# Patient Record
Sex: Male | Born: 1985 | Race: Black or African American | Hispanic: No | Marital: Single | Smoking: Former smoker
Health system: Southern US, Community
[De-identification: ages and names within clinical notes are randomized; demographics above are authoritative.]

## PROBLEM LIST (undated history)

## (undated) DIAGNOSIS — T7840XA Allergy, unspecified, initial encounter: Secondary | ICD-10-CM

## (undated) DIAGNOSIS — K219 Gastro-esophageal reflux disease without esophagitis: Secondary | ICD-10-CM

## (undated) HISTORY — DX: Gastro-esophageal reflux disease without esophagitis: K21.9

## (undated) HISTORY — DX: Allergy, unspecified, initial encounter: T78.40XA

---

## 2009-12-19 ENCOUNTER — Encounter: Admission: RE | Admit: 2009-12-19 | Discharge: 2009-12-19 | Payer: Self-pay | Admitting: Occupational Medicine

## 2010-09-26 ENCOUNTER — Emergency Department: Payer: Self-pay | Admitting: Emergency Medicine

## 2012-03-04 ENCOUNTER — Telehealth: Payer: Self-pay | Admitting: Cardiology

## 2012-03-04 NOTE — Telephone Encounter (Signed)
Spoke with pharmacy and they didn't even have a record of calling

## 2012-03-04 NOTE — Telephone Encounter (Signed)
[facility] called to get refill of cozaar 100 mg Please fax to [phone]

## 2015-06-26 ENCOUNTER — Encounter: Payer: Self-pay | Admitting: Family Medicine

## 2015-06-26 ENCOUNTER — Ambulatory Visit (INDEPENDENT_AMBULATORY_CARE_PROVIDER_SITE_OTHER): Payer: 59 | Admitting: Family Medicine

## 2015-06-26 VITALS — BP 95/61 | HR 86 | Resp 16 | Ht 71.0 in | Wt 161.8 lb

## 2015-06-26 DIAGNOSIS — K219 Gastro-esophageal reflux disease without esophagitis: Secondary | ICD-10-CM | POA: Diagnosis not present

## 2015-06-26 MED ORDER — PANTOPRAZOLE SODIUM 40 MG PO TBEC: 40.0000 mg | DELAYED_RELEASE_TABLET | Freq: Every day | ORAL | Status: DC

## 2015-06-26 NOTE — Progress Notes (Signed)
  Name: Alexander Ball   MRN: [id number]    DOB: 05-12-1986   Date:06/26/2015       Progress Note  Subjective  Chief Complaint  Chief Complaint  Patient presents with  . Heartburn    HPI  Here C/0 "heartburn".  Has reflux issues.  Occ. Related to certain foods, and sometimes not.  Feels acid in back of throat occ.  Has taken Prilosec OTC in past.  Has had sx. On and off for many years.     Past Medical History  Diagnosis Date  . GERD (gastroesophageal reflux disease)     History  Substance Use Topics  . Smoking status: Former Smoker    Types: Cigarettes    Quit date: 06/25/2014  . Smokeless tobacco: Never Used  . Alcohol Use: Yes    No current outpatient prescriptions on file.  Allergies  Allergen Reactions  . Amoxicillin Rash    Review of Systems  Constitutional: Negative for fever, chills, weight loss and malaise/fatigue.  HENT: Positive for congestion.   Eyes: Negative for blurred vision and double vision.  Respiratory: Negative for cough, sputum production, shortness of breath and wheezing.   Cardiovascular: Negative for chest pain, palpitations, claudication and leg swelling.  Gastrointestinal: Positive for heartburn. Negative for nausea, vomiting, abdominal pain, diarrhea, constipation and blood in stool.  Genitourinary: Negative for dysuria, urgency and frequency.  Musculoskeletal: Negative for myalgias and joint pain.  Skin: Negative for rash.  Neurological: Negative for dizziness, weakness and headaches.  Psychiatric/Behavioral: Negative for depression. The patient is not nervous/anxious.       Objective  Filed Vitals:   06/26/15 1507  BP: 95/61  Pulse: 86  Resp: 16  Height:  (1.803 m)  Weight: 161 lb 12.8 oz (73.392 kg)     Physical Exam  Constitutional: He is oriented to person, place, and time and well-developed, well-nourished, and in no distress. No distress.  HENT:  Head: Normocephalic and atraumatic.  Eyes: Conjunctivae  and EOM are normal. Pupils are equal, round, and reactive to light.  Neck: Normal range of motion. Neck supple. No thyromegaly present.  Cardiovascular: Normal rate, regular rhythm, normal heart sounds and intact distal pulses.  Exam reveals no gallop and no friction rub.   No murmur heard. Pulmonary/Chest: Effort normal and breath sounds normal. No respiratory distress. He has no wheezes. He has no rales.  Abdominal: Soft. Bowel sounds are normal. He exhibits no distension and no mass. There is no tenderness.  Musculoskeletal: He exhibits no edema.  Lymphadenopathy:    He has no cervical adenopathy.  Neurological: He is alert and oriented to person, place, and time.  Vitals reviewed.        Assessment & Plan  1. Gastroesophageal reflux disease without esophagitis  - CBC with Differential - Comprehensive Metabolic Panel (CMET) - pantoprazole (PROTONIX) 40 MG tablet; Take 1 tablet (40 mg total) by mouth daily.  Dispense: 30 tablet; Refill: 6

## 2015-06-28 LAB — COMPREHENSIVE METABOLIC PANEL
A/G RATIO: 1.5 (ref 1.1–2.5)
ALBUMIN: 4.6 g/dL (ref 3.5–5.5)
ALT: 18 IU/L (ref 0–44)
AST: 16 IU/L (ref 0–40)
Alkaline Phosphatase: 62 IU/L (ref 39–117)
BILIRUBIN TOTAL: 0.3 mg/dL (ref 0.0–1.2)
BUN / CREAT RATIO: 7 — AB (ref 8–19)
BUN: 7 mg/dL (ref 6–20)
CO2: 23 mmol/L (ref 18–29)
Calcium: 9.7 mg/dL (ref 8.7–10.2)
Chloride: 102 mmol/L (ref 97–108)
Creatinine, Ser: 0.95 mg/dL (ref 0.76–1.27)
GFR, EST AFRICAN AMERICAN: 126 mL/min/{1.73_m2} (ref >59)
GFR, EST NON AFRICAN AMERICAN: 109 mL/min/{1.73_m2} (ref >59)
GLUCOSE: 86 mg/dL (ref 65–99)
Globulin, Total: 3.1 g/dL (ref 1.5–4.5)
POTASSIUM: 4.5 mmol/L (ref 3.5–5.2)
Sodium: 140 mmol/L (ref 134–144)
TOTAL PROTEIN: 7.7 g/dL (ref 6.0–8.5)

## 2015-06-28 LAB — CBC WITH DIFFERENTIAL/PLATELET
BASOS ABS: 0.2 10*3/uL (ref 0.0–0.2)
BASOS: 2 %
EOS (ABSOLUTE): 0.5 10*3/uL — ABNORMAL HIGH (ref 0.0–0.4)
EOS: 6 %
HEMATOCRIT: 39.8 % (ref 37.5–51.0)
HEMOGLOBIN: 13.6 g/dL (ref 12.6–17.7)
IMMATURE GRANS (ABS): 0 10*3/uL (ref 0.0–0.1)
IMMATURE GRANULOCYTES: 0 %
LYMPHS ABS: 2.2 10*3/uL (ref 0.7–3.1)
Lymphs: 26 %
MCH: 28.6 pg (ref 26.6–33.0)
MCHC: 34.2 g/dL (ref 31.5–35.7)
MCV: 84 fL (ref 79–97)
MONOCYTES: 11 %
Monocytes Absolute: 0.9 10*3/uL (ref 0.1–0.9)
NEUTROS ABS: 4.5 10*3/uL (ref 1.4–7.0)
NEUTROS PCT: 55 %
PLATELETS: 342 10*3/uL (ref 150–379)
RBC: 4.75 x10E6/uL (ref 4.14–5.80)
RDW: 13.1 % (ref 12.3–15.4)
WBC: 8.3 10*3/uL (ref 3.4–10.8)

## 2015-09-28 ENCOUNTER — Ambulatory Visit: Payer: 59 | Admitting: Family Medicine

## 2015-10-17 ENCOUNTER — Ambulatory Visit: Payer: 59 | Admitting: Family Medicine

## 2015-11-12 ENCOUNTER — Encounter: Payer: Self-pay | Admitting: Family Medicine

## 2015-11-12 ENCOUNTER — Ambulatory Visit (INDEPENDENT_AMBULATORY_CARE_PROVIDER_SITE_OTHER): Payer: 59 | Admitting: Family Medicine

## 2015-11-12 VITALS — BP 104/70 | HR 75 | Resp 16 | Ht 71.0 in | Wt 163.2 lb

## 2015-11-12 DIAGNOSIS — Z720 Tobacco use: Secondary | ICD-10-CM | POA: Diagnosis not present

## 2015-11-12 DIAGNOSIS — Z87891 Personal history of nicotine dependence: Secondary | ICD-10-CM

## 2015-11-12 DIAGNOSIS — K219 Gastro-esophageal reflux disease without esophagitis: Secondary | ICD-10-CM

## 2015-11-12 NOTE — Progress Notes (Signed)
Name: Alexander Ball   MRN: [id number]    DOB: 1986/10/13   Date:11/12/2015       Progress Note  Subjective  Chief Complaint  Chief Complaint  Patient presents with  . Gastroesophageal Reflux    HPI Here for f/u of GERD.  Using Protonix x 4 months.  Doing very well.    Also to go over lab results.  No problem-specific assessment & plan notes found for this encounter.   Past Medical History  Diagnosis Date  . GERD (gastroesophageal reflux disease)     History reviewed. No pertinent past surgical history.  Family History  Problem Relation Age of Onset  . Diabetes Maternal Uncle   . Heart disease Maternal Uncle   . Hypertension Maternal Uncle     Social History   Social History  . Marital Status: Single    Spouse Name: N/A  . Number of Children: N/A  . Years of Education: N/A   Occupational History  . Not on file.   Social History Main Topics  . Smoking status: Former Smoker    Types: Cigarettes    Quit date: 06/25/2014  . Smokeless tobacco: Never Used  . Alcohol Use: Yes  . Drug Use: No  . Sexual Activity: Yes    Birth Control/ Protection: Inserts, Injection   Other Topics Concern  . Not on file   Social History Narrative     Current outpatient prescriptions:  .  pantoprazole (PROTONIX) 40 MG tablet, Take 1 tablet (40 mg total) by mouth daily., Disp: 30 tablet, Rfl: 6  Allergies  Allergen Reactions  . Amoxicillin Rash     Review of Systems  Constitutional: Negative for fever, chills, weight loss and malaise/fatigue.  HENT: Negative for hearing loss.   Eyes: Negative for blurred vision and double vision.  Respiratory: Negative for cough, shortness of breath and wheezing.   Cardiovascular: Negative for chest pain, palpitations and leg swelling.  Gastrointestinal: Negative for heartburn, abdominal pain and blood in stool.  Genitourinary: Negative for dysuria, urgency and frequency.  Musculoskeletal: Negative for myalgias and joint pain.   Skin: Negative for rash.  Neurological: Negative for dizziness, tremors, weakness and headaches.      Objective  Filed Vitals:   11/12/15 0937  BP: 104/70  Pulse: 75  Resp: 16  Height:  (1.803 m)  Weight: 163 lb 3.2 oz (74.027 kg)    Physical Exam  Constitutional: He is oriented to person, place, and time and well-developed, well-nourished, and in no distress. No distress.  HENT:  Head: Normocephalic and atraumatic.  Neck: Normal range of motion. Neck supple. Carotid bruit is not present. No thyromegaly present.  Cardiovascular: Normal rate, regular rhythm and normal heart sounds.  Exam reveals no gallop and no friction rub.   No murmur heard. Pulmonary/Chest: Effort normal and breath sounds normal. No respiratory distress. He has no wheezes. He has no rales.  Abdominal: Soft. Bowel sounds are normal. He exhibits no distension, no abdominal bruit and no mass. There is no tenderness.  Musculoskeletal: He exhibits no edema.  Lymphadenopathy:    He has no cervical adenopathy.  Neurological: He is alert and oriented to person, place, and time.  Vitals reviewed.      No results found for this or any previous visit (from the past 2160 hour(s)).   Assessment & Plan  Problem List Items Addressed This Visit      Digestive   GERD (gastroesophageal reflux disease) - Primary  Other   History of smoking      No orders of the defined types were placed in this encounter.   1. Gastroesophageal reflux disease, esophagitis presence not specified -may reduce Protonix to every other day and then prn use if tolerated  2. History of smoking

## 2016-02-07 ENCOUNTER — Telehealth: Payer: Self-pay

## 2016-02-07 ENCOUNTER — Other Ambulatory Visit: Payer: Self-pay

## 2016-02-07 DIAGNOSIS — Z202 Contact with and (suspected) exposure to infections with a predominantly sexual mode of transmission: Secondary | ICD-10-CM

## 2016-02-07 NOTE — Telephone Encounter (Signed)
Patient went to [facility] but I explained I can not order until you give me the codes. He will call in morning.[facility]

## 2016-02-07 NOTE — Telephone Encounter (Signed)
Check with me tomorrow with the choices of test and I will let you, which one to order.  Although I do not recommend this test at all.-jh

## 2016-02-07 NOTE — Telephone Encounter (Signed)
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 I explained that he could be carrier and test wont give him all answers. He is aware. I need to know which HSV to order please.

## 2016-02-08 NOTE — Telephone Encounter (Signed)
Need HSV 1 and 2 antibody titers.-jh

## 2016-02-09 LAB — HSV(HERPES SIMPLEX VRS) I + II AB-IGG: HSV 1 Glycoprotein G Ab, IgG: <0.91 index (ref 0.00–0.90)

## 2016-02-11 ENCOUNTER — Telehealth: Payer: Self-pay | Admitting: *Deleted

## 2016-02-18 ENCOUNTER — Telehealth: Payer: Self-pay | Admitting: Family Medicine

## 2016-02-18 NOTE — Telephone Encounter (Signed)
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  Please call [phone].

## 2016-02-18 NOTE — Telephone Encounter (Signed)
Pt was informed.

## 2016-03-04 NOTE — Telephone Encounter (Signed)
Erroneous error.

## 2016-03-05 ENCOUNTER — Other Ambulatory Visit: Payer: Self-pay | Admitting: Family Medicine

## 2016-03-05 DIAGNOSIS — K219 Gastro-esophageal reflux disease without esophagitis: Secondary | ICD-10-CM

## 2016-03-05 MED ORDER — PANTOPRAZOLE SODIUM 40 MG PO TBEC: 40.0000 mg | DELAYED_RELEASE_TABLET | Freq: Every day | ORAL | Status: DC

## 2016-03-22 ENCOUNTER — Ambulatory Visit
Admission: EM | Admit: 2016-03-22 | Discharge: 2016-03-22 | Disposition: A | Discharge status: Home / Self Care | Payer: 59 | Attending: Family Medicine | Admitting: Family Medicine

## 2016-03-22 DIAGNOSIS — N4889 Other specified disorders of penis: Secondary | ICD-10-CM

## 2016-03-22 DIAGNOSIS — N489 Disorder of penis, unspecified: Secondary | ICD-10-CM

## 2016-03-22 NOTE — ED Notes (Signed)
Pt's girlfriend was diagnosed with HSV1, and pt is experiencing a red lesion on his penis, and is wanting to be tested for HSV and other STDs.

## 2016-03-22 NOTE — ED Provider Notes (Signed)
CSN: [id number]     Arrival date & time 03/22/16  1021 History   First MD Initiated Contact with Patient 03/22/16 1316     Chief Complaint  Patient presents with  . Exposure to STD   (Consider location/radiation/quality/duration/timing/severity/associated sxs/prior Treatment) HPI Comments: 30 yo male with a c/o "spots on penis" and wanting to get checked to see if they are herpes lesions. States girlfriend diagnosed with HSV1. States the spots on penis are not blisters or painful/tender. Denies penile discharge, dysuria, hematuria. States was tested by PCP recently for HSV antibodies and tests were negative.   The history is provided by the patient.    Past Medical History  Diagnosis Date  . GERD (gastroesophageal reflux disease)    No past surgical history on file. Family History  Problem Relation Age of Onset  . Diabetes Maternal Uncle   . Heart disease Maternal Uncle   . Hypertension Maternal Uncle    Social History  Substance Use Topics  . Smoking status: Former Smoker    Types: Cigarettes    Quit date: 06/25/2014  . Smokeless tobacco: Never Used  . Alcohol Use: Yes    Review of Systems  Allergies  Amoxicillin  Home Medications   Prior to Admission medications   Medication Sig Start Date End Date Taking? Authorizing Provider  cetirizine (ZYRTEC) 10 MG tablet Take 10 mg by mouth daily.   Yes Historical Provider, MD  pantoprazole (PROTONIX) 40 MG tablet Take 1 tablet (40 mg total) by mouth daily. 03/05/16  Yes Janeann ForehandJames H Hawkins Jr., MD   Meds Ordered and Administered this Visit  Medications - No data to display  BP 114/77 mmHg  Pulse 71  Temp(Src) 97.8 F (36.6 C) (Oral)  Resp 16  Ht 5' 10.5" (1.791 m)  Wt 165 lb (74.844 kg)  BMI 23.33 kg/m2  SpO2 100% No data found.   Physical Exam  Abdominal: Hernia confirmed negative in the right inguinal area and confirmed negative in the left inguinal area.  Genitourinary: Testes normal and penis normal. Cremasteric  reflex is present. No penile erythema or penile tenderness. No discharge found.  Normal penile skin; irritated slightly erythematous hair follicle at base of penis, non-tender, no drainage; no vesicular lesions  Lymphadenopathy:       Right: No inguinal adenopathy present.       Left: No inguinal adenopathy present.  Nursing note and vitals reviewed.   ED Course  Procedures (including critical care time)  Labs Review Labs Reviewed - No data to display  Imaging Review No results found.   Visual Acuity Review  Right Eye Distance:   Left Eye Distance:   Bilateral Distance:    Right Eye Near:   Left Eye Near:    Bilateral Near:         MDM   1. Penile lesion    (benign; no signs or symptoms of herpes infection)    Discharge Medication List as of 03/22/2016  2:18 PM     1. diagnosis reviewed with patient 2. Follow-up prn if symptoms worsen    Payton Mccallumrlando Quierra Silverio, MD 03/31/16 2210

## 2016-12-06 DIAGNOSIS — Z23 Encounter for immunization: Secondary | ICD-10-CM | POA: Diagnosis not present

## 2017-02-04 ENCOUNTER — Other Ambulatory Visit: Payer: Self-pay | Admitting: Family Medicine

## 2017-02-04 DIAGNOSIS — K219 Gastro-esophageal reflux disease without esophagitis: Secondary | ICD-10-CM

## 2017-07-21 ENCOUNTER — Encounter: Payer: Self-pay | Admitting: Family Medicine

## 2017-07-21 ENCOUNTER — Ambulatory Visit (INDEPENDENT_AMBULATORY_CARE_PROVIDER_SITE_OTHER): Payer: 59 | Admitting: Family Medicine

## 2017-07-21 ENCOUNTER — Other Ambulatory Visit: Payer: Self-pay | Admitting: Family Medicine

## 2017-07-21 VITALS — BP 107/56 | HR 72 | Temp 97.6°F | Resp 16 | Ht 71.0 in | Wt 152.0 lb

## 2017-07-21 DIAGNOSIS — Z114 Encounter for screening for human immunodeficiency virus [HIV]: Secondary | ICD-10-CM

## 2017-07-21 DIAGNOSIS — R7989 Other specified abnormal findings of blood chemistry: Secondary | ICD-10-CM

## 2017-07-21 DIAGNOSIS — K219 Gastro-esophageal reflux disease without esophagitis: Secondary | ICD-10-CM | POA: Diagnosis not present

## 2017-07-21 DIAGNOSIS — J358 Other chronic diseases of tonsils and adenoids: Secondary | ICD-10-CM | POA: Insufficient documentation

## 2017-07-21 DIAGNOSIS — Z13220 Encounter for screening for lipoid disorders: Secondary | ICD-10-CM

## 2017-07-21 DIAGNOSIS — Z0000 Encounter for general adult medical examination without abnormal findings: Secondary | ICD-10-CM

## 2017-07-21 DIAGNOSIS — R799 Abnormal finding of blood chemistry, unspecified: Secondary | ICD-10-CM

## 2017-07-21 MED ORDER — PANTOPRAZOLE SODIUM 20 MG PO TBEC: 20.0000 mg | DELAYED_RELEASE_TABLET | Freq: Every day | ORAL | 5 refills | Status: DC

## 2017-07-21 NOTE — Patient Instructions (Addendum)
Thank you for coming to the clinic today.  1. For Tonsilliths - recommend OTC Peroxyl (oral debridement mouthwash) use 1-2 times daily for about 1 week, then stop, and see if any improvement in frequency and amount of stones. Can use periodically as needed, caution overuse - May continue to manually expel if needed - Can also continue or try salt water gargles help reduce tonsillar swelling and irritation - Agree with your diet changes  2. GERD  - Start the PPI with Pantoprazole 20mg  generic one capsule 30 min before first meal of day. Regularly for 3-6 months may need for long-term  - Avoid spicy, greasy, fried foods, also things like caffeine, dark chocolate, peppermint can worsen - Avoid large meals and late night snacks, also do not go more than 4-5 hours without a snack or meal (not eating will worsen reflux symptoms due to stomach acid)  Also try to elevate head of bed.  If the problem improves but keeps coming back, we can discuss higher dose or longer course at next visit.  If symptoms are worsening, persistent symptoms despite treatment or develop esophageal or abdominal pain, unable to swallow solids or liquids, nausea, vomiting, fever/chills, or unintentional weight loss / no appetite, please follow-up sooner or seek more immediate medical attention.  DUE for FASTING BLOOD WORK (no food or drink after midnight before the lab appointment, only water or coffee without cream/sugar on the morning of)  SCHEDULE "Lab Only" visit in the morning at the clinic for lab draw in 2-4 weeks  - Make sure Lab Only appointment is at about 1 week before your next appointment, so that results will be available  For Lab Results, once available within 2-3 days of blood draw, you can can log in to MyChart online to view your results and a brief explanation. Also, we can discuss results at next follow-up visit.  Please schedule a Follow-up Appointment to: Return in about 4 weeks (around 08/18/2017)  for Annual Physical.  If you have any other questions or concerns, please feel free to call the clinic or send a message through MyChart. You may also schedule an earlier appointment if necessary.  Additionally, you may be receiving a survey about your experience at our clinic within a few days to 1 week by e-mail or mail. We value your feedback.  Saralyn Pilar, DO [facility], [place]

## 2017-07-21 NOTE — Progress Notes (Signed)
Subjective:    Patient ID: Alexander Ball, male    DOB: 1986-03-29, 31 y.o.   MRN: [id number]  Jamario Colina is a 31 y.o. male presenting on 07/21/2017 for Gastroesophageal Reflux and tonsil stone (feels like swollen glands )   HPI   TONSILLITHS: Reports concerns worsening over past 3 months, with recurrent tonsilliths and associated swelling and irritation of tonsil if he manually expels stones, R is significantly worse than L, both tonsils with some crypts but asymmetrical. - He has tried to adjust diet and lifestyle, limited alcohol, sweets, followed with Dentist, has continued upkeep with teeth, but no improvement overall. Tried salt water gargles limited relief not using regular - Admits foul odor with tonsilliths, bad breath - Denies any sore throat, redness, exudates, fever/chills  GERD: - Prior history of chronic GERD in past was on intermittent dosing 5-6 years of PPI, in past was using Prilosec OTC 20mg  and also tried Pantoprazole 40mg . In past prior PCP had recommended to taper off or take intermittent qod dosing, but ineffective. He has stopped taking the PPI for while about 2-3 months, and modified diet, tried reducing alcohol, but then worsening now with recurrence. 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 Smoking status: Former Smoker    Types: Cigarettes    Quit date: 06/25/2014  . Smokeless tobacco:  Never Used  . Alcohol use Yes    Review of Systems Per HPI unless specifically indicated above     Objective:    BP (!) 107/56   Pulse 72   Temp 97.6 F (36.4 C) (Oral)   Resp 16   Ht 5\' 11"  (1.803 m)   Wt 152 lb (68.9 kg)   BMI 21.20 kg/m   Wt Readings from Last 3 Encounters:  07/21/17 152 lb (68.9 kg)  03/22/16 165 lb (74.8 kg)  11/12/15 163 lb 3.2 oz (74 kg)    Physical Exam  Constitutional: He is oriented to person, place, and time. He appears well-developed and well-nourished. No distress.  Well-appearing, comfortable, cooperative  HENT:  Head: Normocephalic and atraumatic.  Mouth/Throat: Oropharynx is clear and moist.  Frontal / maxillary sinuses non-tender. Nares patent without purulence or edema.   Oropharynx clear without erythema, exudates, edema or asymmetry. Bilateral tonsils with some notable crypts without tonsillith today. R > L in size some asymmetry.  Eyes: Conjunctivae are normal. Right eye exhibits no discharge. Left eye exhibits no discharge.  Neck: Normal range of motion. Neck supple. No thyromegaly present.  Non tender  Cardiovascular: Normal rate and intact distal pulses.   Pulmonary/Chest: Effort normal.  Abdominal: Soft. He exhibits no distension. There is no tenderness.  Musculoskeletal: Normal range of motion. He exhibits no edema.  Lymphadenopathy:    He has no cervical adenopathy.  Neurological: He is alert and oriented to person, place, and time.  Skin: Skin is warm and dry. No rash noted. He is not diaphoretic. No erythema.  Psychiatric: He has a normal mood and affect. His behavior is normal.  Well groomed, good eye contact, normal speech and thoughts  Nursing note and vitals reviewed.      Assessment & Plan:   Problem List Items Addressed This Visit    Tonsillith    No active tonsillith today, history suggestive, tonsils with multiple crypts No evidence of complication  Plan: 1. Reassurance, discussed risk factors, dietary  changes 2. Recommend trial OTC oral debridement mouthwash Peroxyl BID 1 week then PRN use - Future consider referral ENT if chronic recurrence or problems      GERD (gastroesophageal reflux disease) - Primary    Suspected subacute flare on chronic GERD with recent worsening without PPI - No GI red flag symptoms. Exam is unremarkable with benign abdomen without pain today - Chronic history of GERD >5 years, multiple prior treatments H2 PPI - No prior GI or EGD eval  Plan: 1. Discussed chronic management of GERD with PPI  2. Start new rx Pantoprazole generic 20mg  daily 30 min prior to 1st meal for now - can continue for weeks to months - may need indefinitely 3. Diet modifications reduce GERD, reduce alcohol 4. Check future labs, return 4 weeks follow-up annual phys      Relevant Medications   pantoprazole (PROTONIX) 20 MG tablet      Meds ordered this encounter  Medications  . pantoprazole (PROTONIX) 20 MG tablet    Sig: Take 1 tablet (20 mg total) by mouth daily before breakfast. Before 1st meal of day    Dispense:  30 tablet    Refill:  5    Follow up plan: Return in about 4 weeks (around 08/18/2017) for Annual Physical.  Saralyn Pilar, DO [facility] 07/21/2017, 10:33 PM

## 2017-07-21 NOTE — Assessment & Plan Note (Addendum)
No active tonsillith today, history suggestive, tonsils with multiple crypts No evidence of complication  Plan: 1. Reassurance, discussed risk factors, dietary changes 2. Recommend trial OTC oral debridement mouthwash Peroxyl BID 1 week then PRN use - Future consider referral ENT if chronic recurrence or problems

## 2017-07-21 NOTE — Assessment & Plan Note (Signed)
Suspected subacute flare on chronic GERD with recent worsening without PPI - No GI red flag symptoms. Exam is unremarkable with benign abdomen without pain today - Chronic history of GERD >5 years, multiple prior treatments H2 PPI - No prior GI or EGD eval  Plan: 1. Discussed chronic management of GERD with PPI  2. Start new rx Pantoprazole generic 20mg  daily 30 min prior to 1st meal for now - can continue for weeks to months - may need indefinitely 3. Diet modifications reduce GERD, reduce alcohol 4. Check future labs, return 4 weeks follow-up annual phys

## 2017-08-11 ENCOUNTER — Other Ambulatory Visit: Payer: 59

## 2017-08-12 ENCOUNTER — Other Ambulatory Visit: Payer: 59

## 2017-08-13 LAB — LIPID PANEL
CHOL/HDL RATIO: 2.8 (calc) (ref <5.0)
Cholesterol: 128 mg/dL (ref <200)
HDL: 45 mg/dL (ref >40)
LDL CHOLESTEROL (CALC): 71 mg/dL
NON-HDL CHOLESTEROL (CALC): 83 mg/dL (ref <130)
TRIGLYCERIDES: 42 mg/dL (ref <150)

## 2017-08-13 LAB — COMPLETE METABOLIC PANEL WITH GFR
AG RATIO: 1.5 (calc) (ref 1.0–2.5)
ALBUMIN MSPROF: 4.3 g/dL (ref 3.6–5.1)
ALT: 16 U/L (ref 9–46)
AST: 18 U/L (ref 10–40)
Alkaline phosphatase (APISO): 53 U/L (ref 40–115)
BUN: 9 mg/dL (ref 7–25)
CALCIUM: 9.3 mg/dL (ref 8.6–10.3)
CO2: 25 mmol/L (ref 20–32)
Chloride: 106 mmol/L (ref 98–110)
Creat: 0.99 mg/dL (ref 0.60–1.35)
GFR, EST AFRICAN AMERICAN: 119 mL/min/{1.73_m2} (ref >=60)
GFR, EST NON AFRICAN AMERICAN: 102 mL/min/{1.73_m2} (ref >=60)
GLUCOSE: 90 mg/dL (ref 65–99)
Globulin: 2.8 g/dL (calc) (ref 1.9–3.7)
Potassium: 4.6 mmol/L (ref 3.5–5.3)
Sodium: 138 mmol/L (ref 135–146)
TOTAL PROTEIN: 7.1 g/dL (ref 6.1–8.1)
Total Bilirubin: 0.3 mg/dL (ref 0.2–1.2)

## 2017-08-13 LAB — HEMOGLOBIN A1C
EAG (MMOL/L): 5.5 (calc)
Hgb A1c MFr Bld: 5.1 % of total Hgb (ref <5.7)
Mean Plasma Glucose: 100 (calc)

## 2017-08-13 LAB — CBC WITH DIFFERENTIAL/PLATELET
BASOS ABS: 79 {cells}/uL (ref 0–200)
Basophils Relative: 1.3 %
EOS PCT: 6.1 %
Eosinophils Absolute: 372 cells/uL (ref 15–500)
HEMATOCRIT: 38.7 % (ref 38.5–50.0)
Hemoglobin: 12.9 g/dL — ABNORMAL LOW (ref 13.2–17.1)
LYMPHS ABS: 2086 {cells}/uL (ref 850–3900)
MCH: 29.1 pg (ref 27.0–33.0)
MCHC: 33.3 g/dL (ref 32.0–36.0)
MCV: 87.4 fL (ref 80.0–100.0)
MPV: 10.3 fL (ref 7.5–12.5)
Monocytes Relative: 10.7 %
NEUTROS PCT: 47.7 %
Neutro Abs: 2910 cells/uL (ref 1500–7800)
Platelets: 281 10*3/uL (ref 140–400)
RBC: 4.43 10*6/uL (ref 4.20–5.80)
RDW: 11.9 % (ref 11.0–15.0)
Total Lymphocyte: 34.2 %
WBC mixed population: 653 cells/uL (ref 200–950)
WBC: 6.1 10*3/uL (ref 3.8–10.8)

## 2017-08-13 LAB — HIV ANTIBODY (ROUTINE TESTING W REFLEX): HIV 1&2 Ab, 4th Generation: NONREACTIVE

## 2017-08-19 ENCOUNTER — Encounter: Payer: Self-pay | Admitting: Family Medicine

## 2017-08-19 ENCOUNTER — Other Ambulatory Visit: Payer: Self-pay | Admitting: Family Medicine

## 2017-08-19 ENCOUNTER — Ambulatory Visit (INDEPENDENT_AMBULATORY_CARE_PROVIDER_SITE_OTHER): Payer: 59 | Admitting: Family Medicine

## 2017-08-19 VITALS — BP 104/63 | HR 71 | Temp 97.7°F | Resp 16 | Ht 71.0 in | Wt 150.6 lb

## 2017-08-19 DIAGNOSIS — J358 Other chronic diseases of tonsils and adenoids: Secondary | ICD-10-CM | POA: Diagnosis not present

## 2017-08-19 DIAGNOSIS — K219 Gastro-esophageal reflux disease without esophagitis: Secondary | ICD-10-CM

## 2017-08-19 DIAGNOSIS — D649 Anemia, unspecified: Secondary | ICD-10-CM

## 2017-08-19 DIAGNOSIS — J3089 Other allergic rhinitis: Secondary | ICD-10-CM | POA: Insufficient documentation

## 2017-08-19 DIAGNOSIS — Z0000 Encounter for general adult medical examination without abnormal findings: Secondary | ICD-10-CM | POA: Diagnosis not present

## 2017-08-19 DIAGNOSIS — R799 Abnormal finding of blood chemistry, unspecified: Secondary | ICD-10-CM

## 2017-08-19 NOTE — Assessment & Plan Note (Signed)
Unspecified etiology of normocytic anemia, suspected most likely IDA, possibly dietary or genetic (limited family history from parents) - Prior result Hgb 13.6 (2016), last lab Hgb 12.9 borderline low - Also reported check in interval from blood bank was told Hgb 11-12 - No family history of cancers, including colon cancer, no other diagnostics at this time  Plan: 1. Discussed potential causes of low hemoglobin - overall reassurance since asymptomatic most likely some chronic degree of mild anemia. 2. Monitor for signs and symptoms of anemia vs bleeding or cause 3. Can improve dietary iron sources, or add MVI with iron 4. Follow-up yearly Hgb for now if worsening trend or new concerns, will add other work-up iron and anemia studies as well.

## 2017-08-19 NOTE — Progress Notes (Signed)
Subjective:    Patient ID: Alexander Ball, male    DOB: 1986-04-09, 31 y.o.   MRN: [id number]  Alexander Ball is a 31 y.o. male presenting on 08/19/2017 for Annual Exam   HPI   Here for Annual Physical and Lab review  FOLLOW-UP Tonsiliths - Last visit with me 07/21/17, for initial visit for same problem, treated with dietary counseling and trial OTC peroxyl mouthwash, see prior notes for background information. - Interval update with using peroxyl but limited results, still has problem intermittently every 4-7 days recurrent tonsilliths  - Today patient reports no new concerns. He still is able to manually express the tonsilliths - Still admits occasional foul breath odor due to stones - None actively today  FOLLOW-UP GERD - Last visit with me 07/21/17, for initial visit for same problem, treated with restarted PPI daily now Pantoprazole , see prior notes for background information. - Interval update with significant improvement back on PPI daily, has some breakthrough symptoms if misses dose or forgets to take until later in day - Today patient reports no new concerns. Currently asymptomatic while taking PPI. Also tried to improve diet avoid trigger foods - Denies any abdominal pain, nausea vomiting, dark stool or blood in stool  ANEMIA, Normcytic - Reports he was not aware of chronic anemia or other problem related. He does not know significant amount of PMH due to mother passed early age and father not in life. He has 2 sisters that are healthy. No known genetic anemia or other cause. - Recent lab 08/12/17 showed Hemoglobin 12.9, mildly low, compared to prior lab 2016 was normal - New update today he states that she attempted to give blood 6 weeks ago and was told he could not due to low Hemoglobin was told 11 to 12, does not recall number. - He is asymptomatic at this time - Denies any known blood loss (gums, hematuria, other bleeding, stool or rectal)  Seasonal /  Environmental Allergies: - Reports chronic problem usually Spring and late Summer, less often in Fall, but recently past 1 week has had some worsening allergy symptoms, admits mild rhinorrhea and congestion. - Usually takes OTC Cetirizine, none recently  Health Maintenance: - Due for Flu Shot, will get through work in October, will notify us or fax record - UTD otherwise TDap and routine HIV screen 07/2017 negative  Depression screen [facility] 2/9 08/19/2017 11/12/2015 06/26/2015  Decreased Interest 0 0 0  Down, Depressed, Hopeless 0 0 0  PHQ - 2 Score 0 0 0    Past Medical History:  Diagnosis Date  . GERD (gastroesophageal reflux disease)    No past surgical history on file. Social History   Social History  . Marital status: Single    Spouse name: N/A  . Number of children: N/A  . Years of education: N/A   Occupational History  . 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 Smoking status: Former Smoker    Types: Cigarettes    Quit date: 06/25/2014  . Smokeless tobacco: Former Neurosurgeon  . Alcohol use Yes  . Drug use: No  . Sexual activity: Yes    Birth control/ protection: Inserts, Injection   Other Topics Concern  . Not on file   Social History Narrative  . No narrative on file   Family History  Problem Relation Age of Onset  . Heart disease Maternal Uncle   . Hypertension Maternal Uncle   . Diabetes Maternal Aunt  Current Outpatient Prescriptions on File Prior to Visit  Medication Sig  . cetirizine (ZYRTEC) 10 MG tablet Take 10 mg by mouth as needed.   . pantoprazole (PROTONIX) 20 MG tablet Take 1 tablet (20 mg total) by mouth daily before breakfast. Before 1st meal of day   No current facility-administered medications on file prior to visit.     Review of Systems  Constitutional: Negative for activity change, appetite change, chills, diaphoresis, fatigue, fever and unexpected weight change.  HENT: Positive for congestion and rhinorrhea. Negative  for hearing loss and sinus pressure.   Eyes: Negative for discharge and visual disturbance.  Respiratory: Negative for apnea, cough, chest tightness, shortness of breath and wheezing.   Cardiovascular: Negative for chest pain, palpitations and leg swelling.  Gastrointestinal: Negative for abdominal pain, anal bleeding, blood in stool, constipation, diarrhea, nausea and vomiting.  Endocrine: Negative for cold intolerance and polyuria.  Genitourinary: Negative for difficulty urinating, dysuria, frequency and hematuria.  Musculoskeletal: Negative for arthralgias, back pain and neck pain.  Skin: Negative for rash.  Allergic/Immunologic: Positive for environmental allergies.  Neurological: Negative for dizziness, weakness, light-headedness, numbness and headaches.  Hematological: Negative for adenopathy.  Psychiatric/Behavioral: Negative for behavioral problems, dysphoric mood and sleep disturbance.   Per HPI unless specifically indicated above     Objective:    BP 104/63   Pulse 71   Temp 97.7 F (36.5 C) (Oral)   Resp 16   Ht  (1.803 m)   Wt 150 lb 9.6 oz (68.3 kg)   BMI 21.00 kg/m   Wt Readings from Last 3 Encounters:  08/19/17 150 lb 9.6 oz (68.3 kg)  07/21/17 152 lb (68.9 kg)  03/22/16 165 lb (74.8 kg)    Physical Exam  Constitutional: He is oriented to person, place, and time. He appears well-developed and well-nourished. No distress.  Well-appearing, comfortable, cooperative  HENT:  Head: Normocephalic and atraumatic.  Mouth/Throat: Oropharynx is clear and moist.  Frontal / maxillary sinuses non-tender. Nares mostly patent with mild deeper turbinate edema without congestion. Bilateral TMs clear with minimal effusion R>L no bulging, no erythemag. Oropharynx clear without erythema, exudates, edema or asymmetry. Tonsils have extensive crypts without any obvious tonsilliths present   Eyes: Pupils are equal, round, and reactive to light. Conjunctivae and EOM are normal.  Right eye exhibits no discharge. Left eye exhibits no discharge.  Neck: Normal range of motion. Neck supple. No thyromegaly present.  Cardiovascular: Normal rate, regular rhythm, normal heart sounds and intact distal pulses.   No murmur heard. Pulmonary/Chest: Effort normal and breath sounds normal. No respiratory distress. He has no wheezes. He has no rales.  Abdominal: Soft. Bowel sounds are normal. He exhibits no distension and no mass. There is no tenderness.  Musculoskeletal: Normal range of motion. He exhibits no edema or tenderness.  Upper / Lower Extremities: - Normal muscle tone, strength bilateral upper extremities 5/5, lower extremities 5/5  Lymphadenopathy:    He has no cervical adenopathy.  Neurological: He is alert and oriented to person, place, and time.  Distal sensation intact to light touch all extremities  Skin: Skin is warm and dry. No rash noted. He is not diaphoretic. No erythema.  Psychiatric: He has a normal mood and affect. His behavior is normal.  Well groomed, good eye contact, normal speech and thoughts  Nursing note and vitals reviewed.  Results for orders placed or performed in visit on 08/11/17  COMPLETE METABOLIC PANEL WITH GFR  Result Value Ref Range  Glucose, Bld 90 65 - 99 mg/dL   BUN 9 7 - 25 mg/dL   Creat 3.79 0.24 - 0.97 mg/dL   GFR, Est Non African American 102 > OR = 60 mL/min/1.101m2   GFR, Est African American 119 > OR = 60 mL/min/1.37m2   BUN/Creatinine Ratio NOT APPLICABLE 6 - 22 (calc)   Sodium 138 135 - 146 mmol/L   Potassium 4.6 3.5 - 5.3 mmol/L   Chloride 106 98 - 110 mmol/L   CO2 25 20 - 32 mmol/L   Calcium 9.3 8.6 - 10.3 mg/dL   Total Protein 7.1 6.1 - 8.1 g/dL   Albumin 4.3 3.6 - 5.1 g/dL   Globulin 2.8 1.9 - 3.7 g/dL (calc)   AG Ratio 1.5 1.0 - 2.5 (calc)   Total Bilirubin 0.3 0.2 - 1.2 mg/dL   Alkaline phosphatase (APISO) 53 40 - 115 U/L   AST 18 10 - 40 U/L   ALT 16 9 - 46 U/L  Lipid panel  Result Value Ref Range    Cholesterol 128 <200 mg/dL   HDL 45 >35 mg/dL   Triglycerides 42 <329 mg/dL   LDL Cholesterol (Calc) 71 mg/dL (calc)   Total CHOL/HDL Ratio 2.8 <5.0 (calc)   Non-HDL Cholesterol (Calc) 83 <924 mg/dL (calc)  Hemoglobin Q6S  Result Value Ref Range   Hgb A1c MFr Bld 5.1 <5.7 % of total Hgb   Mean Plasma Glucose 100 (calc)   eAG (mmol/L) 5.5 (calc)  CBC with Differential/Platelet  Result Value Ref Range   WBC 6.1 3.8 - 10.8 Thousand/uL   RBC 4.43 4.20 - 5.80 Million/uL   Hemoglobin 12.9 (L) 13.2 - 17.1 g/dL   HCT 34.1 96.2 - 22.9 %   MCV 87.4 80.0 - 100.0 fL   MCH 29.1 27.0 - 33.0 pg   MCHC 33.3 32.0 - 36.0 g/dL   RDW 79.8 92.1 - 19.4 %   Platelets 281 140 - 400 Thousand/uL   MPV 10.3 7.5 - 12.5 fL   Neutro Abs 2,910 1,500 - 7,800 cells/uL   Lymphs Abs 2,086 850 - 3,900 cells/uL   WBC mixed population 653 200 - 950 cells/uL   Eosinophils Absolute 372 15 - 500 cells/uL   Basophils Absolute 79 0 - 200 cells/uL   Neutrophils Relative % 47.7 %   Total Lymphocyte 34.2 %   Monocytes Relative 10.7 %   Eosinophils Relative 6.1 %   Basophils Relative 1.3 %  HIV antibody  Result Value Ref Range   HIV 1&2 Ab, 4th Generation NON-REACTIVE NON-REACTI      Assessment & Plan:   Problem List Items Addressed This Visit    Environmental and seasonal allergies    Stable allergies, possible mild flare now Trial back on OTC Cetirizine if interested, future consider Flonase and or Nasal Saline vs Neti pot, reviewed options      GERD (gastroesophageal reflux disease)    Dramatically improved GERD now controlled on daily AM PPI - No GI red flag symptoms. Exam is still unremarkable with benign abdomen without pain today - Chronic history of GERD >5 years, multiple prior treatments H2 PPI - No prior GI or EGD eval  Plan: 1. Continue Pantoprazole generic  daily 30 min prior to 1st meal - anticipate may try for 3-6 months then can try to self taper or wean down to QOD, may need  indefinitely 2. Diet modifications reduce GERD, reduce alcohol 3. Follow-up yearly - may provide update to office in 6 months on  PPI status      Normocytic anemia    Unspecified etiology of normocytic anemia, suspected most likely IDA, possibly dietary or genetic (limited family history from parents) - Prior result Hgb 13.6 (2016), last lab Hgb 12.9 borderline low - Also reported check in interval from blood bank was told Hgb 11-12 - No family history of cancers, including colon cancer, no other diagnostics at this time  Plan: 1. Discussed potential causes of low hemoglobin - overall reassurance since asymptomatic most likely some chronic degree of mild anemia. 2. Monitor for signs and symptoms of anemia vs bleeding or cause 3. Can improve dietary iron sources, or add MVI with iron 4. Follow-up yearly Hgb for now if worsening trend or new concerns, will add other work-up iron and anemia studies as well.      Tonsillith    Stable, unchanged on peroxyl and oral hygiene changes No active tonsillith today, history suggestive, tonsils with multiple crypts No evidence of complication  Plan: 1. Reassurance again today - continue diet changes, and may continue Peroxyl if helping otherwise may try other mouthwash options. Otherwise limited other treatments available 2. Future consider referral ENT if chronic recurrence or problems       Other Visit Diagnoses    Annual physical exam    -  Primary      No orders of the defined types were placed in this encounter.   Follow up plan: Return in about 1 year (around 08/19/2018) for Annual Physical.  Saralyn Pilar, DO [facility] 08/19/2017, 3:46 PM

## 2017-08-19 NOTE — Patient Instructions (Addendum)
Thank you for coming to the clinic today.  1. Keep up the good work!  2. Only abnormality on labs - mildly low Hemoglobin (minimal anemia), compared to labs 2 years ago. Could be more recent or this could be a mild chronic longer-term issue. - May increase Dietary iron sources, green leafy vegetables and red meat if interested. May also take a Multivitamin with Iron - Notify office if worsening symptoms or concern for anemia, or any potential source of bleeding  3. Continue Pantoprazole daily for now as you are, we can continue this for up to 6 months and then may try to taper off every other day. Be careful with stopping cold [place] to avoid rebound.  Flu Shot in October, notify office or fax form.  Please schedule a Follow-up Appointment to: Return in about 1 year (around 08/19/2018) for Annual Physical.  If you have any other questions or concerns, please feel free to call the clinic or send a message through MyChart. You may also schedule an earlier appointment if necessary.  Additionally, you may be receiving a survey about your experience at our clinic within a few days to 1 week by e-mail or mail. We value your feedback.  Saralyn Pilar, DO [facility], [place]

## 2017-08-19 NOTE — Assessment & Plan Note (Addendum)
Dramatically improved GERD now controlled on daily AM PPI - No GI red flag symptoms. Exam is still unremarkable with benign abdomen without pain today - Chronic history of GERD >5 years, multiple prior treatments H2 PPI - No prior GI or EGD eval  Plan: 1. Continue Pantoprazole generic  daily 30 min prior to 1st meal - anticipate may try for 3-6 months then can try to self taper or wean down to QOD, may need indefinitely 2. Diet modifications reduce GERD, reduce alcohol 3. Follow-up yearly - may provide update to office in 6 months on PPI status

## 2017-08-19 NOTE — Assessment & Plan Note (Signed)
Stable, unchanged on peroxyl and oral hygiene changes No active tonsillith today, history suggestive, tonsils with multiple crypts No evidence of complication  Plan: 1. Reassurance again today - continue diet changes, and may continue Peroxyl if helping otherwise may try other mouthwash options. Otherwise limited other treatments available 2. Future consider referral ENT if chronic recurrence or problems

## 2017-08-19 NOTE — Assessment & Plan Note (Signed)
Stable allergies, possible mild flare now Trial back on OTC Cetirizine if interested, future consider Flonase and or Nasal Saline vs Neti pot, reviewed options

## 2017-11-20 DIAGNOSIS — Z23 Encounter for immunization: Secondary | ICD-10-CM | POA: Diagnosis not present

## 2018-03-20 ENCOUNTER — Other Ambulatory Visit: Payer: Self-pay | Admitting: Family Medicine

## 2018-03-20 DIAGNOSIS — K219 Gastro-esophageal reflux disease without esophagitis: Secondary | ICD-10-CM

## 2018-03-31 ENCOUNTER — Encounter: Payer: Self-pay | Admitting: Family Medicine

## 2018-03-31 ENCOUNTER — Ambulatory Visit: Payer: 59 | Admitting: Family Medicine

## 2018-03-31 VITALS — BP 113/68 | HR 77 | Temp 98.4°F | Resp 16 | Ht 71.0 in | Wt 155.0 lb

## 2018-03-31 DIAGNOSIS — Z20828 Contact with and (suspected) exposure to other viral communicable diseases: Secondary | ICD-10-CM

## 2018-03-31 DIAGNOSIS — N489 Disorder of penis, unspecified: Secondary | ICD-10-CM | POA: Diagnosis not present

## 2018-03-31 NOTE — Patient Instructions (Addendum)
Thank you for coming to the office today.  Reassuring today, does not seem as consistent with an HSV infection or genital wart.  As discussed, cannot completely rule out HSV, and this is most likely based on the history of the possible infections. We will check blood test again same as 01/2016, yours were negative before. This checks for antibody in blood for HSV1 and HSV2, if negative again more reassuring. But if positive, cannot confirm until we do a swab of the lesion as soon as it develops within 24-48 hours of onset in future.  For now, it does seem more consistent with a dermatitis similar to what the other doctors have said. I would use less irritating topical treatments such as neosporin type or plain vaseline for both spots, and for the dry skin area towards base of shaft can use topical Hydrocortisone 1% or low dose OTC, use only sparingly as this can lighten skin, use for 1-2 times a day for 1 week or less  Routine skin hygiene would use less abrasive soaps and more water.   Please schedule a Follow-up Appointment to: Return for keep physical in october 2019.  If you have any other questions or concerns, please feel free to call the office or send a message through MyChart. You may also schedule an earlier appointment if necessary.  Additionally, you may be receiving a survey about your experience at our office within a few days to 1 week by e-mail or mail. We value your feedback.  Saralyn Pilar, DO [facility], [place]

## 2018-03-31 NOTE — Progress Notes (Signed)
Subjective:    Patient ID: Alexander Ball, male    DOB: 1986-02-11, 32 y.o.   MRN: [id number]  Alexander Ball is a 32 y.o. 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 He had lab test and his result was negative for blood HSV1 and HSV2 in 01/2016. - Now here with new onset again similar rash in similar area of penis, onset 1 week ago seems improved, again he did admit to "messing with it" and "squeezed one of the areas", thinks more of the rash and seemed a little worse than last time - Admits drainage of small amt fluid when squeezed only - Recent illness cough cold fever chills, lasted about 1 week then resolved - Denies any pain, itching, fevers, chills, testicular pain or tenderness or bump  Depression screen [facility] 2/9 08/19/2017 11/12/2015 06/26/2015  Decreased Interest 0 0 0  Down, Depressed, Hopeless 0 0 0  PHQ - 2 Score 0 0 0    Social History   Tobacco Use  . Smoking status: Former Smoker    Types: Cigarettes    Last attempt to quit: 06/25/2014    Years since quitting: 3.7  . Smokeless tobacco: Former Engineer, water Use Topics  . Alcohol use: Yes  . Drug use: No    Review of Systems Per HPI unless specifically indicated above     Objective:    BP 113/68   Pulse 77   Temp 98.4 F (36.9 C) (Oral)   Resp 16   Ht  (1.803 m)   Wt 155 lb (70.3 kg)   BMI 21.62 kg/m   Wt Readings from Last 3 Encounters:  03/31/18 155 lb (70.3 kg)  08/19/17 150  lb 9.6 oz (68.3 kg)  07/21/17 152 lb (68.9 kg)    Physical Exam  Constitutional: He appears well-developed and well-nourished. No distress.  Genitourinary:  Genitourinary Comments: External genitalia, male - Penis is uncircumcised, normal appearance overall, scrotum appears normal he declines full genital exam, no obvious other rash or lymphadenopathy - on dorsal penile shaft there are two areas of skin lesion - 1) distal small 0.5 mm or smaller superficial circular loss of skin as if vesicle or blister or skin sore has resolved, healing - 2) more proximal 0.5 cm approx area of normal skin that seems to have slight dry appearance, no erythema, non tender, no raised lesion, no vesicles, no abrasion or ulceration - non tender  No evidence of genital warts  Neurological: He is alert.  Skin: Skin is warm and dry. He is not diaphoretic.  Nursing note and vitals reviewed.  Results for orders placed or performed in visit on 08/11/17  COMPLETE METABOLIC PANEL WITH GFR  Result Value Ref Range   Glucose, Bld 90 65 - 99 mg/dL   BUN 9 7 - 25 mg/dL   Creat 4.09 8.11 - 9.14 mg/dL   GFR, Est Non African American 102 >  OR = 60 mL/min/1.92m2   GFR, Est African American 119 > OR = 60 mL/min/1.14m2   BUN/Creatinine Ratio NOT APPLICABLE 6 - 22 (calc)   Sodium 138 135 - 146 mmol/L   Potassium 4.6 3.5 - 5.3 mmol/L   Chloride 106 98 - 110 mmol/L   CO2 25 20 - 32 mmol/L   Calcium 9.3 8.6 - 10.3 mg/dL   Total Protein 7.1 6.1 - 8.1 g/dL   Albumin 4.3 3.6 - 5.1 g/dL   Globulin 2.8 1.9 - 3.7 g/dL (calc)   AG Ratio 1.5 1.0 - 2.5 (calc)   Total Bilirubin 0.3 0.2 - 1.2 mg/dL   Alkaline phosphatase (APISO) 53 40 - 115 U/L   AST 18 10 - 40 U/L   ALT 16 9 - 46 U/L  Lipid panel  Result Value Ref Range   Cholesterol 128 <200 mg/dL   HDL 45 >16 mg/dL   Triglycerides 42 <109 mg/dL   LDL Cholesterol (Calc) 71 mg/dL (calc)   Total CHOL/HDL Ratio 2.8 <5.0 (calc)   Non-HDL Cholesterol (Calc) 83 <604 mg/dL (calc)   Hemoglobin V4U  Result Value Ref Range   Hgb A1c MFr Bld 5.1 <5.7 % of total Hgb   Mean Plasma Glucose 100 (calc)   eAG (mmol/L) 5.5 (calc)  CBC with Differential/Platelet  Result Value Ref Range   WBC 6.1 3.8 - 10.8 Thousand/uL   RBC 4.43 4.20 - 5.80 Million/uL   Hemoglobin 12.9 (L) 13.2 - 17.1 g/dL   HCT 98.1 19.1 - 47.8 %   MCV 87.4 80.0 - 100.0 fL   MCH 29.1 27.0 - 33.0 pg   MCHC 33.3 32.0 - 36.0 g/dL   RDW 29.5 62.1 - 30.8 %   Platelets 281 140 - 400 Thousand/uL   MPV 10.3 7.5 - 12.5 fL   Neutro Abs 2,910 1,500 - 7,800 cells/uL   Lymphs Abs 2,086 850 - 3,900 cells/uL   WBC mixed population 653 200 - 950 cells/uL   Eosinophils Absolute 372 15 - 500 cells/uL   Basophils Absolute 79 0 - 200 cells/uL   Neutrophils Relative % 47.7 %   Total Lymphocyte 34.2 %   Monocytes Relative 10.7 %   Eosinophils Relative 6.1 %   Basophils Relative 1.3 %  HIV antibody  Result Value Ref Range   HIV 1&2 Ab, 4th Generation NON-REACTIVE NON-REACTI      Assessment & Plan:   Problem List Items Addressed This Visit    None    Visit Diagnoses    Penile lesion    -  Primary   Relevant Orders   HSV(herpes simplex vrs) 1+2 ab-IgG   Exposure to herpes simplex virus (HSV)       Relevant Orders   HSV(herpes simplex vrs) 1+2 ab-IgG      Clinical history is suggestive of possible HSV infection with infrequent recurrences, however based on actual description and appearance, seems less likely, could still be mild case. He does not have oral involvement. 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, and patient concern with recurrences over few years, advised that we can offer repeat blood test HSV1/HSV2 antibody IgG - if negative further reassurance less likely - then possible skin dermatitis as he has been advised by other providers, maybe worsened by topical alcohol rub - If HSV testing today is positive via serum then still cannot confirm an active infection - He was advised to return to [place] for [facility] acute visit for swab for HSV culture on 24-48 hrs of onset rash if recurrence again - Asymptomatic regarding other STD, and he declines repeat HIV,RPR, GC/Chlam testing - Reassurance for today - may use topical neosporin, vaseline, help heal, can use small amt OTC low dose hydrocortisone if dry skin or itch - avoid too often can cause hypopigmentation - Follow-up as scheduled 5 months for annual phys and other labs  No orders of the defined types were placed in this encounter.   Follow up plan: Return for keep physical in october 2019.  Already has future labs ordered for 08/2018 - he has declined further STD screening.  Saralyn Pilar, DO [facility] 03/31/2018, 1:24 PM

## 2018-04-01 ENCOUNTER — Encounter: Payer: Self-pay | Admitting: Family Medicine

## 2018-04-01 DIAGNOSIS — A6000 Herpesviral infection of urogenital system, unspecified: Secondary | ICD-10-CM | POA: Insufficient documentation

## 2018-04-01 LAB — HSV(HERPES SIMPLEX VRS) I + II AB-IGG
HSV 1 IGG, TYPE SPEC: 1.54 {index} — AB
HSV 2 IGG,TYPE SPECIFIC AB: <0.9 index

## 2018-08-11 ENCOUNTER — Other Ambulatory Visit: Payer: 59

## 2018-08-20 ENCOUNTER — Encounter: Payer: 59 | Admitting: Family Medicine

## 2018-08-25 ENCOUNTER — Other Ambulatory Visit: Payer: 59

## 2018-08-25 DIAGNOSIS — R799 Abnormal finding of blood chemistry, unspecified: Secondary | ICD-10-CM | POA: Diagnosis not present

## 2018-08-25 DIAGNOSIS — K219 Gastro-esophageal reflux disease without esophagitis: Secondary | ICD-10-CM | POA: Diagnosis not present

## 2018-08-25 DIAGNOSIS — D649 Anemia, unspecified: Secondary | ICD-10-CM | POA: Diagnosis not present

## 2018-08-25 DIAGNOSIS — Z0000 Encounter for general adult medical examination without abnormal findings: Secondary | ICD-10-CM

## 2018-08-26 LAB — COMPLETE METABOLIC PANEL WITH GFR
AG RATIO: 1.6 (calc) (ref 1.0–2.5)
ALBUMIN MSPROF: 4.6 g/dL (ref 3.6–5.1)
ALT: 11 U/L (ref 9–46)
AST: 14 U/L (ref 10–40)
Alkaline phosphatase (APISO): 50 U/L (ref 40–115)
BUN: 16 mg/dL (ref 7–25)
CALCIUM: 9.8 mg/dL (ref 8.6–10.3)
CO2: 28 mmol/L (ref 20–32)
CREATININE: 1.09 mg/dL (ref 0.60–1.35)
Chloride: 104 mmol/L (ref 98–110)
GFR, EST NON AFRICAN AMERICAN: 91 mL/min/{1.73_m2} (ref >=60)
GFR, Est African American: 105 mL/min/{1.73_m2} (ref >=60)
GLOBULIN: 2.9 g/dL (ref 1.9–3.7)
Glucose, Bld: 90 mg/dL (ref 65–99)
Potassium: 4.4 mmol/L (ref 3.5–5.3)
SODIUM: 138 mmol/L (ref 135–146)
Total Bilirubin: 0.5 mg/dL (ref 0.2–1.2)
Total Protein: 7.5 g/dL (ref 6.1–8.1)

## 2018-08-26 LAB — CBC WITH DIFFERENTIAL/PLATELET
BASOS PCT: 1.3 %
Basophils Absolute: 88 cells/uL (ref 0–200)
EOS ABS: 279 {cells}/uL (ref 15–500)
EOS PCT: 4.1 %
HCT: 39.6 % (ref 38.5–50.0)
Hemoglobin: 13.2 g/dL (ref 13.2–17.1)
Lymphs Abs: 2217 cells/uL (ref 850–3900)
MCH: 28.8 pg (ref 27.0–33.0)
MCHC: 33.3 g/dL (ref 32.0–36.0)
MCV: 86.3 fL (ref 80.0–100.0)
MONOS PCT: 11.4 %
MPV: 10 fL (ref 7.5–12.5)
NEUTROS ABS: 3441 {cells}/uL (ref 1500–7800)
Neutrophils Relative %: 50.6 %
PLATELETS: 321 10*3/uL (ref 140–400)
RBC: 4.59 10*6/uL (ref 4.20–5.80)
RDW: 12.1 % (ref 11.0–15.0)
TOTAL LYMPHOCYTE: 32.6 %
WBC mixed population: 775 cells/uL (ref 200–950)
WBC: 6.8 10*3/uL (ref 3.8–10.8)

## 2018-08-26 LAB — LIPID PANEL
CHOLESTEROL: 145 mg/dL (ref <200)
HDL: 49 mg/dL (ref >40)
LDL Cholesterol (Calc): 81 mg/dL (calc)
Non-HDL Cholesterol (Calc): 96 mg/dL (calc) (ref <130)
Total CHOL/HDL Ratio: 3 (calc) (ref <5.0)
Triglycerides: 69 mg/dL (ref <150)

## 2018-08-26 LAB — HEMOGLOBIN A1C
HEMOGLOBIN A1C: 5.2 %{Hb} (ref <5.7)
MEAN PLASMA GLUCOSE: 103 (calc)
eAG (mmol/L): 5.7 (calc)

## 2018-08-30 ENCOUNTER — Encounter: Payer: Self-pay | Admitting: Family Medicine

## 2018-08-30 ENCOUNTER — Other Ambulatory Visit: Payer: Self-pay | Admitting: Family Medicine

## 2018-08-30 ENCOUNTER — Ambulatory Visit (INDEPENDENT_AMBULATORY_CARE_PROVIDER_SITE_OTHER): Payer: 59 | Admitting: Family Medicine

## 2018-08-30 VITALS — BP 109/69 | HR 72 | Temp 98.0°F | Resp 16 | Ht 71.0 in | Wt 154.0 lb

## 2018-08-30 DIAGNOSIS — Z0000 Encounter for general adult medical examination without abnormal findings: Secondary | ICD-10-CM

## 2018-08-30 DIAGNOSIS — D649 Anemia, unspecified: Secondary | ICD-10-CM

## 2018-08-30 DIAGNOSIS — K219 Gastro-esophageal reflux disease without esophagitis: Secondary | ICD-10-CM

## 2018-08-30 DIAGNOSIS — J3089 Other allergic rhinitis: Secondary | ICD-10-CM

## 2018-08-30 DIAGNOSIS — J358 Other chronic diseases of tonsils and adenoids: Secondary | ICD-10-CM

## 2018-08-30 NOTE — Assessment & Plan Note (Signed)
RESOLVED on repeat CBC Will follow in future but no new concerns now

## 2018-08-30 NOTE — Assessment & Plan Note (Signed)
Stable, controlled Continue antihistamines

## 2018-08-30 NOTE — Assessment & Plan Note (Signed)
Controlled GERD on PPI No GI red flag symptoms Chronic history GERD >5-6 year No prior GI or EGD  Plan Continue Pantoprazole 20mg  rx daily - has refills - future may try wean down if needed Diet modifications Follow-up q 1 yearly

## 2018-08-30 NOTE — Patient Instructions (Addendum)
Thank you for coming to the office today.  All labs look good overall as discussed.  Try to stay very well hydrated with plenty of water before blood work yearly  Continue current medication with Pantoprazole 20mg  daily before meals, if due for further refills have pharmacy send [place] a request.  Ask family about Prostate / Colon cancer history to make sure this is up to date, we would consider screening age 30+ for these.  Flu shot at pharmacy - make sure we have a copy of this record, scan a picture to [place] on mychart of the paperwork or fax it or drop it off   DUE for FASTING BLOOD WORK (no food or drink after midnight before the lab appointment, only water or coffee without cream/sugar on the morning of)  SCHEDULE "Lab Only" visit in the morning at the clinic for lab draw in 1 YEAR  - Make sure Lab Only appointment is at about 1 week before your next appointment, so that results will be available  For Lab Results, once available within 2-3 days of blood draw, you can can log in to MyChart online to view your results and a brief explanation. Also, we can discuss results at next follow-up visit.   Please schedule a Follow-up Appointment to: Return in about 1 year (around 08/31/2019) for Annual Physical.  If you have any other questions or concerns, please feel free to call the office or send a message through MyChart. You may also schedule an earlier appointment if necessary.  Additionally, you may be receiving a survey about your experience at our office within a few days to 1 week by e-mail or mail. We value your feedback.  Saralyn Pilar, DO [facility], [place]

## 2018-08-30 NOTE — Progress Notes (Signed)
Subjective:    Patient ID: Alexander Ball, male    DOB: 31-Aug-1986, 32 y.o.   MRN: [id number]  Alexander Ball is a 32 y.o. male presenting on 08/30/2018 for Annual Exam   HPI  Here for Annual Physical and Lab Review.  Lifestyle - He remains active overall, and doing well. He admits to not regularly exercising and having as much endurance as he would like. - Balanced diet - Sleeping well  FOLLOW-UP GERD - Today patient reports no new concerns. Currently asymptomatic while taking PPI Pantoprazole 20mg  daily, doing well. - Also tried to improve diet avoid trigger foods  Seasonal / Environmental Allergies: - Reports chronic problem usually Spring and late Summer, less often in Fall, but recently past 1 week has had some worsening allergy symptoms, admits mild rhinorrhea and congestion. - Usually takes OTC Cetirizine, none recently  Additional follow-up topics: - Tonsilliths - previous discussion 06/2017, has tried variety of treatment/prevention methods without significant improvement, still has episodes intermittently, no new concerns or worsening at this time. - History of anemia - repeat labs showed normalized Hgb, he remains asymptomatic. No other fam history of anemia known. Prior history of blood donation in past - Penile/Genital skin lesion - has resolved with topical antibiotic ointment in past, without significant recurrence or new concern  Health Maintenance: Due for Flu Shot, will receive at pharmacy, send [place] copy of report   Depression screen [facility] 2/9 08/30/2018 08/19/2017 11/12/2015  Decreased Interest 0 0 0  Down, Depressed, Hopeless 0 0 0  PHQ - 2 Score 0 0 0    Past Medical History:  Diagnosis Date  . GERD (gastroesophageal reflux disease)    History reviewed. No pertinent surgical history. Social History   Socioeconomic History  . Marital status: Single    Spouse name: Not on file  . Number of children: Not on file  . Years of education: Not  on file  . Highest education level: Not on file  Occupational History  . 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 Financial resource strain: Not on file  . Food insecurity:    Worry: Not on file    Inability: Not on file  . Transportation needs:    Medical: Not on file    Non-medical: Not on file  Tobacco Use  . Smoking status: Former Smoker    Types: Cigarettes    Last attempt to quit: 06/25/2014    Years since quitting: 4.1  . Smokeless tobacco: Former Engineer, water and Sexual Activity  . Alcohol use: Yes  . Drug use: No  . Sexual activity: Yes    Birth control/protection: Inserts, Injection  Lifestyle  . Physical activity:    Days per week: Not on file    Minutes per session: Not on file  . Stress: Not on file  Relationships  . Social connections:    Talks on phone: Not on file    Gets together: Not on file    Attends religious service: Not on file    Active member of club or organization: Not on file    Attends meetings of clubs or organizations: Not on file    Relationship status: Not on file  . Intimate partner violence:    Fear of current or ex partner: Not on file    Emotionally abused: Not on file    Physically abused: Not on file    Forced sexual activity: Not on file  Other Topics Concern  .  Not on file  Social History Narrative  . Not on file   Family History  Problem Relation Age of Onset  . Heart disease Maternal Uncle   . Hypertension Maternal Uncle   . Diabetes Maternal Aunt   . Prostate cancer Neg Hx   . Colon cancer Neg Hx    Current Outpatient Medications on File Prior to Visit  Medication Sig  . cetirizine (ZYRTEC) 10 MG tablet Take 10 mg by mouth as needed.   . pantoprazole (PROTONIX) 20 MG tablet TAKE 1 TABLET BY MOUTH ONCE DAILY BEFORE BREAKFAST(OR FIRST MEAL OF DAY)   No current facility-administered medications on file prior to visit.     Review of Systems  Constitutional: Negative for activity change, appetite  change, chills, diaphoresis, fatigue and fever.  HENT: Negative for congestion and hearing loss.   Eyes: Negative for visual disturbance.  Respiratory: Negative for apnea, cough, choking, chest tightness, shortness of breath and wheezing.   Cardiovascular: Negative for chest pain, palpitations and leg swelling.  Gastrointestinal: Negative for abdominal pain, anal bleeding, blood in stool, constipation, diarrhea, nausea and vomiting.  Endocrine: Negative for cold intolerance and polyuria.  Genitourinary: Negative for decreased urine volume, difficulty urinating, dysuria, frequency, hematuria, testicular pain and urgency.  Musculoskeletal: Negative for arthralgias, back pain and neck pain.  Skin: Negative for rash.  Allergic/Immunologic: Positive for environmental allergies.  Neurological: Negative for dizziness, weakness, light-headedness, numbness and headaches.  Hematological: Negative for adenopathy.  Psychiatric/Behavioral: Negative for behavioral problems, dysphoric mood and sleep disturbance. The patient is not nervous/anxious.    Per HPI unless specifically indicated above     Objective:    BP 109/69   Pulse 72   Temp 98 F (36.7 C) (Oral)   Resp 16   Ht 5\' 11"  (1.803 m)   Wt 154 lb (69.9 kg)   BMI 21.48 kg/m   Wt Readings from Last 3 Encounters:  08/30/18 154 lb (69.9 kg)  03/31/18 155 lb (70.3 kg)  08/19/17 150 lb 9.6 oz (68.3 kg)    Physical Exam  Constitutional: He is oriented to person, place, and time. He appears well-developed and well-nourished. No distress.  Well-appearing, comfortable, cooperative  HENT:  Head: Normocephalic and atraumatic.  Mouth/Throat: Oropharynx is clear and moist.  Frontal / maxillary sinuses non-tender. Nares patent without purulence or edema. Bilateral TMs clear without erythema, effusion or bulging. Oropharynx clear without erythema, exudates, edema or asymmetry - no tonsillith today  Eyes: Pupils are equal, round, and reactive to  light. Conjunctivae and EOM are normal. Right eye exhibits no discharge. Left eye exhibits no discharge.  Neck: Normal range of motion. Neck supple. No thyromegaly present.  Cardiovascular: Normal rate, regular rhythm, normal heart sounds and intact distal pulses.  No murmur heard. Pulmonary/Chest: Effort normal and breath sounds normal. No respiratory distress. He has no wheezes. He has no rales.  Abdominal: Soft. Bowel sounds are normal. He exhibits no distension and no mass. There is no tenderness.  Musculoskeletal: Normal range of motion. He exhibits no edema or tenderness.  Upper / Lower Extremities: - Normal muscle tone, strength bilateral upper extremities 5/5, lower extremities 5/5  Lymphadenopathy:    He has no cervical adenopathy.  Neurological: He is alert and oriented to person, place, and time.  Distal sensation intact to light touch all extremities  Skin: Skin is warm and dry. No rash noted. He is not diaphoretic. No erythema.  R hand side of index finger into hand with area of slight  dry skin with flaking possible deeper palpable nodularity very fine - question if very early palmar wart, non tender  Psychiatric: He has a normal mood and affect. His behavior is normal.  Well groomed, good eye contact, normal speech and thoughts  Nursing note and vitals reviewed.  Results for orders placed or performed in visit on 08/25/18  CBC with Differential/Platelet  Result Value Ref Range   WBC 6.8 3.8 - 10.8 Thousand/uL   RBC 4.59 4.20 - 5.80 Million/uL   Hemoglobin 13.2 13.2 - 17.1 g/dL   HCT 45.4 09.8 - 11.9 %   MCV 86.3 80.0 - 100.0 fL   MCH 28.8 27.0 - 33.0 pg   MCHC 33.3 32.0 - 36.0 g/dL   RDW 14.7 82.9 - 56.2 %   Platelets 321 140 - 400 Thousand/uL   MPV 10.0 7.5 - 12.5 fL   Neutro Abs 3,441 1,500 - 7,800 cells/uL   Lymphs Abs 2,217 850 - 3,900 cells/uL   WBC mixed population 775 200 - 950 cells/uL   Eosinophils Absolute 279 15 - 500 cells/uL   Basophils Absolute 88 0 -  200 cells/uL   Neutrophils Relative % 50.6 %   Total Lymphocyte 32.6 %   Monocytes Relative 11.4 %   Eosinophils Relative 4.1 %   Basophils Relative 1.3 %  Hemoglobin A1c  Result Value Ref Range   Hgb A1c MFr Bld 5.2 <5.7 % of total Hgb   Mean Plasma Glucose 103 (calc)   eAG (mmol/L) 5.7 (calc)  Lipid panel  Result Value Ref Range   Cholesterol 145 <200 mg/dL   HDL 49 >13 mg/dL   Triglycerides 69 <086 mg/dL   LDL Cholesterol (Calc) 81 mg/dL (calc)   Total CHOL/HDL Ratio 3.0 <5.0 (calc)   Non-HDL Cholesterol (Calc) 96 <578 mg/dL (calc)  COMPLETE METABOLIC PANEL WITH GFR  Result Value Ref Range   Glucose, Bld 90 65 - 99 mg/dL   BUN 16 7 - 25 mg/dL   Creat 4.69 6.29 - 5.28 mg/dL   GFR, Est Non African American 91 > OR = 60 mL/min/1.75m2   GFR, Est African American 105 > OR = 60 mL/min/1.40m2   BUN/Creatinine Ratio NOT APPLICABLE 6 - 22 (calc)   Sodium 138 135 - 146 mmol/L   Potassium 4.4 3.5 - 5.3 mmol/L   Chloride 104 98 - 110 mmol/L   CO2 28 20 - 32 mmol/L   Calcium 9.8 8.6 - 10.3 mg/dL   Total Protein 7.5 6.1 - 8.1 g/dL   Albumin 4.6 3.6 - 5.1 g/dL   Globulin 2.9 1.9 - 3.7 g/dL (calc)   AG Ratio 1.6 1.0 - 2.5 (calc)   Total Bilirubin 0.5 0.2 - 1.2 mg/dL   Alkaline phosphatase (APISO) 50 40 - 115 U/L   AST 14 10 - 40 U/L   ALT 11 9 - 46 U/L      Assessment & Plan:   Problem List Items Addressed This Visit    Environmental and seasonal allergies    Stable, controlled Continue antihistamines      GERD (gastroesophageal reflux disease)    Controlled GERD on PPI No GI red flag symptoms Chronic history GERD >5-6 year No prior GI or EGD  Plan Continue Pantoprazole 20mg  rx daily - has refills - future may try wean down if needed Diet modifications Follow-up q 1 yearly      Normocytic anemia    RESOLVED on repeat CBC Will follow in future but no new concerns now  Tonsillith    Stable without worsening Has intermittent recurrences Continue strategy to  help prevent, but limited benefit       Other Visit Diagnoses    Annual physical exam    -  Primary      Updated Health Maintenance information - Flu shot at pharmacy await record Reviewed recent lab results with patient Encouraged improvement to lifestyle with diet and exercise - Goal of weight loss   No orders of the defined types were placed in this encounter.   Follow up plan: Return in about 1 year (around 08/31/2019) for Annual Physical.  Future labs ordered for 08/2019  Saralyn Pilar, DO [facility] 08/30/2018, 1:14 PM

## 2018-08-30 NOTE — Assessment & Plan Note (Signed)
Stable without worsening Has intermittent recurrences Continue strategy to help prevent, but limited benefit

## 2019-01-18 NOTE — Telephone Encounter (Signed)
Mychart message

## 2019-10-10 ENCOUNTER — Other Ambulatory Visit: Payer: Self-pay | Admitting: Family Medicine

## 2019-10-10 DIAGNOSIS — K219 Gastro-esophageal reflux disease without esophagitis: Secondary | ICD-10-CM

## 2020-03-04 ENCOUNTER — Other Ambulatory Visit: Payer: Self-pay | Admitting: Family Medicine

## 2020-03-04 DIAGNOSIS — K219 Gastro-esophageal reflux disease without esophagitis: Secondary | ICD-10-CM

## 2020-03-13 ENCOUNTER — Ambulatory Visit (INDEPENDENT_AMBULATORY_CARE_PROVIDER_SITE_OTHER): Payer: 59 | Admitting: Family Medicine

## 2020-03-13 ENCOUNTER — Other Ambulatory Visit: Payer: Self-pay | Admitting: Family Medicine

## 2020-03-13 ENCOUNTER — Encounter: Payer: Self-pay | Admitting: Family Medicine

## 2020-03-13 ENCOUNTER — Other Ambulatory Visit: Payer: Self-pay

## 2020-03-13 VITALS — BP 121/63 | HR 65 | Temp 97.1°F | Resp 16 | Ht 71.0 in | Wt 158.0 lb

## 2020-03-13 DIAGNOSIS — K219 Gastro-esophageal reflux disease without esophagitis: Secondary | ICD-10-CM

## 2020-03-13 DIAGNOSIS — Z0000 Encounter for general adult medical examination without abnormal findings: Secondary | ICD-10-CM

## 2020-03-13 DIAGNOSIS — J3089 Other allergic rhinitis: Secondary | ICD-10-CM | POA: Diagnosis not present

## 2020-03-13 MED ORDER — PANTOPRAZOLE SODIUM 20 MG PO TBEC: 20.0000 mg | DELAYED_RELEASE_TABLET | Freq: Every day | ORAL | 3 refills | Status: DC

## 2020-03-13 NOTE — Assessment & Plan Note (Signed)
Controlled on OTC anti histamine

## 2020-03-13 NOTE — Progress Notes (Signed)
Subjective:    Patient ID: Alexander Ball, male    DOB: [birth year], 34 y.o.   MRN: [id number]  Ball Alexander is a 34 y.o. male presenting on 03/13/2020 for Gastroesophageal Reflux (needs refill for protonix)   HPI   FOLLOW-UP GERD - Last visit with me 08/2018, for last physical and addressed same problem, treated with Pantoprazole 20mg  daily, see prior notes for background information. - Interval update with no new concerns, doing well on med, needs refill - Today patient reports his symptoms of GERD are controlled. He takes pantoprazole 20mg  most days before breakfast, 1st thing in AM. It works well. Controlls symptoms. He occasionally forgets or misses a dose or may skip it for a week or more at a time and does fairly well, he may have some breakthrough symptoms and resume med, usual food dietary triggers, otherwise he is doing well and is due for refill Denies any abdominal pain, nausea vomiting dark stools  Seasonal / Environmental Allergies: - Reports chronic problem usually Spring and late Summer On Cetirizine OTC   Health Maintenance: UTD COVID19 Vaccine  Depression screen [facility] 2/9 03/13/2020 08/30/2018 08/19/2017  Decreased Interest 0 0 0  Down, Depressed, Hopeless 0 0 0  PHQ - 2 Score 0 0 0    Social History   Tobacco Use  . Smoking status: Former Smoker    Types: Cigarettes    Quit date: 06/25/2014    Years since quitting: 5.7  . Smokeless tobacco: Former Network engineer Use Topics  . Alcohol use: Yes  . Drug use: No    Review of Systems Per HPI unless specifically indicated above     Objective:    BP 121/63   Pulse 65   Temp (!) 97.1 F (36.2 C) (Temporal)   Resp 16   Ht 5\' 11"  (1.803 m)   Wt 158 lb (71.7 kg)   BMI 22.04 kg/m   Wt Readings from Last 3 Encounters:  03/13/20 158 lb (71.7 kg)  08/30/18 154 lb (69.9 kg)  03/31/18 155 lb (70.3 kg)    Physical Exam Vitals and nursing note reviewed.  Constitutional:      General: He is  not in acute distress.    Appearance: He is well-developed. He is not diaphoretic.     Comments: Well-appearing, comfortable, cooperative  HENT:     Head: Normocephalic and atraumatic.  Eyes:     General:        Right eye: No discharge.        Left eye: No discharge.     Conjunctiva/sclera: Conjunctivae normal.  Cardiovascular:     Rate and Rhythm: Normal rate.  Pulmonary:     Effort: Pulmonary effort is normal.  Skin:    General: Skin is warm and dry.     Findings: No erythema or rash.  Neurological:     Mental Status: He is alert and oriented to person, place, and time.  Psychiatric:        Behavior: Behavior normal.     Comments: Well groomed, good eye contact, normal speech and thoughts    Results for orders placed or performed in visit on 08/25/18  CBC with Differential/Platelet  Result Value Ref Range   WBC 6.8 3.8 - 10.8 Thousand/uL   RBC 4.59 4.20 - 5.80 Million/uL   Hemoglobin 13.2 13.2 - 17.1 g/dL   HCT 39.6 38.5 - 50.0 %   MCV 86.3 80.0 - 100.0 fL   MCH 28.8 27.0 -  33.0 pg   MCHC 33.3 32.0 - 36.0 g/dL   RDW 44.3 15.4 - 00.8 %   Platelets 321 140 - 400 Thousand/uL   MPV 10.0 7.5 - 12.5 fL   Neutro Abs 3,441 1,500 - 7,800 cells/uL   Lymphs Abs 2,217 850 - 3,900 cells/uL   WBC mixed population 775 200 - 950 cells/uL   Eosinophils Absolute 279 15 - 500 cells/uL   Basophils Absolute 88 0 - 200 cells/uL   Neutrophils Relative % 50.6 %   Total Lymphocyte 32.6 %   Monocytes Relative 11.4 %   Eosinophils Relative 4.1 %   Basophils Relative 1.3 %  Hemoglobin A1c  Result Value Ref Range   Hgb A1c MFr Bld 5.2 <5.7 % of total Hgb   Mean Plasma Glucose 103 (calc)   eAG (mmol/L) 5.7 (calc)  Lipid panel  Result Value Ref Range   Cholesterol 145 <200 mg/dL   HDL 49 >67 mg/dL   Triglycerides 69 <619 mg/dL   LDL Cholesterol (Calc) 81 mg/dL (calc)   Total CHOL/HDL Ratio 3.0 <5.0 (calc)   Non-HDL Cholesterol (Calc) 96 <509 mg/dL (calc)  COMPLETE METABOLIC PANEL WITH  GFR  Result Value Ref Range   Glucose, Bld 90 65 - 99 mg/dL   BUN 16 7 - 25 mg/dL   Creat 3.26 7.12 - 4.58 mg/dL   GFR, Est Non African American 91 > OR = 60 mL/min/1.74m2   GFR, Est African American 105 > OR = 60 mL/min/1.86m2   BUN/Creatinine Ratio NOT APPLICABLE 6 - 22 (calc)   Sodium 138 135 - 146 mmol/L   Potassium 4.4 3.5 - 5.3 mmol/L   Chloride 104 98 - 110 mmol/L   CO2 28 20 - 32 mmol/L   Calcium 9.8 8.6 - 10.3 mg/dL   Total Protein 7.5 6.1 - 8.1 g/dL   Albumin 4.6 3.6 - 5.1 g/dL   Globulin 2.9 1.9 - 3.7 g/dL (calc)   AG Ratio 1.6 1.0 - 2.5 (calc)   Total Bilirubin 0.5 0.2 - 1.2 mg/dL   Alkaline phosphatase (APISO) 50 40 - 115 U/L   AST 14 10 - 40 U/L   ALT 11 9 - 46 U/L      Assessment & Plan:   Problem List Items Addressed This Visit    GERD without esophagitis - Primary    Controlled GERD on PPI No GI red flag symptoms Chronic history GERD >6+ year No prior GI or EGD  Plan Continue Pantoprazole 20mg  rx daily - 90 day to [facility] mail order with refills, he may continue to take it intermittently, may skip 1-2 weeks at a time if prefer Diet modifications      Relevant Medications   pantoprazole (PROTONIX) 20 MG tablet   Environmental and seasonal allergies    Controlled on OTC anti histamine         Meds ordered this encounter  Medications  . pantoprazole (PROTONIX) 20 MG tablet    Sig: Take 1 tablet (20 mg total) by mouth daily before breakfast.    Dispense:  90 tablet    Refill:  3    90 day supply     Follow up plan: Return in about 4 weeks (around 04/10/2020) for Annual Physical.  Future labs ordered for 04/02/20  06/02/20, DO [facility] 03/13/2020, 8:14 AM

## 2020-03-13 NOTE — Patient Instructions (Addendum)
Thank you for coming to the office today.  Refilled Pantoprazole 20mg  daily - sent to [facility] 90 day with 3 refills for up to 1 year.  Let me know if any issues with rx   DUE for FASTING BLOOD WORK (no food or drink after midnight before the lab appointment, only water or coffee without cream/sugar on the morning of)  SCHEDULE "Lab Only" visit in the morning at the clinic for lab draw in 4 WEEKS   - Make sure Lab Only appointment is at about 1 week before your next appointment, so that results will be available  For Lab Results, once available within 2-3 days of blood draw, you can can log in to MyChart online to view your results and a brief explanation. Also, we can discuss results at next follow-up visit.   Please schedule a Follow-up Appointment to: Return in about 4 weeks (around 04/10/2020) for Annual Physical.  If you have any other questions or concerns, please feel free to call the office or send a message through MyChart. You may also schedule an earlier appointment if necessary.  Additionally, you may be receiving a survey about your experience at our office within a few days to 1 week by e-mail or mail. We value your feedback.  04/12/2020, DO [facility], [facility]

## 2020-03-13 NOTE — Assessment & Plan Note (Signed)
Controlled GERD on PPI No GI red flag symptoms Chronic history GERD >6+ year No prior GI or EGD  Plan Continue Pantoprazole 20mg  rx daily - 90 day to [facility] mail order with refills, he may continue to take it intermittently, may skip 1-2 weeks at a time if prefer Diet modifications

## 2020-04-02 ENCOUNTER — Other Ambulatory Visit: Payer: 59

## 2020-04-02 DIAGNOSIS — Z0000 Encounter for general adult medical examination without abnormal findings: Secondary | ICD-10-CM

## 2020-04-03 LAB — CBC WITH DIFFERENTIAL/PLATELET
Absolute Monocytes: 839 cells/uL (ref 200–950)
Basophils Absolute: 78 cells/uL (ref 0–200)
Basophils Relative: 1.2 %
Eosinophils Absolute: 397 cells/uL (ref 15–500)
Eosinophils Relative: 6.1 %
HCT: 39 % (ref 38.5–50.0)
Hemoglobin: 13.1 g/dL — ABNORMAL LOW (ref 13.2–17.1)
Lymphs Abs: 1788 cells/uL (ref 850–3900)
MCH: 29.3 pg (ref 27.0–33.0)
MCHC: 33.6 g/dL (ref 32.0–36.0)
MCV: 87.2 fL (ref 80.0–100.0)
MPV: 10.2 fL (ref 7.5–12.5)
Monocytes Relative: 12.9 %
Neutro Abs: 3400 cells/uL (ref 1500–7800)
Neutrophils Relative %: 52.3 %
Platelets: 325 10*3/uL (ref 140–400)
RBC: 4.47 10*6/uL (ref 4.20–5.80)
RDW: 12 % (ref 11.0–15.0)
Total Lymphocyte: 27.5 %
WBC: 6.5 10*3/uL (ref 3.8–10.8)

## 2020-04-03 LAB — COMPLETE METABOLIC PANEL WITH GFR
AG Ratio: 1.5 (calc) (ref 1.0–2.5)
ALT: 39 U/L (ref 9–46)
AST: 43 U/L — ABNORMAL HIGH (ref 10–40)
Albumin: 4.3 g/dL (ref 3.6–5.1)
Alkaline phosphatase (APISO): 49 U/L (ref 36–130)
BUN: 18 mg/dL (ref 7–25)
CO2: 25 mmol/L (ref 20–32)
Calcium: 9.5 mg/dL (ref 8.6–10.3)
Chloride: 105 mmol/L (ref 98–110)
Creat: 0.98 mg/dL (ref 0.60–1.35)
GFR, Est African American: 119 mL/min/{1.73_m2} (ref >=60)
GFR, Est Non African American: 102 mL/min/{1.73_m2} (ref >=60)
Globulin: 2.8 g/dL (calc) (ref 1.9–3.7)
Glucose, Bld: 98 mg/dL (ref 65–99)
Potassium: 4.5 mmol/L (ref 3.5–5.3)
Sodium: 136 mmol/L (ref 135–146)
Total Bilirubin: 0.4 mg/dL (ref 0.2–1.2)
Total Protein: 7.1 g/dL (ref 6.1–8.1)

## 2020-04-03 LAB — LIPID PANEL
Cholesterol: 131 mg/dL (ref <200)
HDL: 42 mg/dL (ref >=40)
LDL Cholesterol (Calc): 75 mg/dL (calc)
Non-HDL Cholesterol (Calc): 89 mg/dL (calc) (ref <130)
Total CHOL/HDL Ratio: 3.1 (calc) (ref <5.0)
Triglycerides: 48 mg/dL (ref <150)

## 2020-04-03 LAB — HEMOGLOBIN A1C
Hgb A1c MFr Bld: 5 % of total Hgb (ref <5.7)
Mean Plasma Glucose: 97 (calc)
eAG (mmol/L): 5.4 (calc)

## 2020-04-09 ENCOUNTER — Other Ambulatory Visit: Payer: Self-pay | Admitting: Family Medicine

## 2020-04-09 ENCOUNTER — Encounter: Payer: Self-pay | Admitting: Family Medicine

## 2020-04-09 ENCOUNTER — Ambulatory Visit (INDEPENDENT_AMBULATORY_CARE_PROVIDER_SITE_OTHER): Payer: 59 | Admitting: Family Medicine

## 2020-04-09 ENCOUNTER — Other Ambulatory Visit: Payer: Self-pay

## 2020-04-09 VITALS — BP 123/62 | HR 75 | Temp 97.5°F | Resp 16 | Ht 71.0 in | Wt 161.0 lb

## 2020-04-09 DIAGNOSIS — Z0000 Encounter for general adult medical examination without abnormal findings: Secondary | ICD-10-CM

## 2020-04-09 DIAGNOSIS — R7401 Elevation of levels of liver transaminase levels: Secondary | ICD-10-CM

## 2020-04-09 DIAGNOSIS — D649 Anemia, unspecified: Secondary | ICD-10-CM | POA: Diagnosis not present

## 2020-04-09 DIAGNOSIS — K219 Gastro-esophageal reflux disease without esophagitis: Secondary | ICD-10-CM | POA: Diagnosis not present

## 2020-04-09 NOTE — Progress Notes (Signed)
Subjective:    Patient ID: Alexander Ball, male    DOB: [birth year], 34 y.o.   MRN: [id number]  Alexander Ball is a 34 y.o. male presenting on 04/09/2020 for Annual Exam   HPI   Here for Annual Physical and Lab Review.  GERD Elevated LFT AST No alcohol about 2-3 weeks prior to lab testing recently Result Mild elevated AST to 43, normal ALT, higher than prior results. No prior abnormal. No fam history liver disease Concern with previous heavier drinking back over the past 12-18 months He was coping with some stress, mother passed away 05-22-19 He was seeing therapist about 1 month ago Currently cut out alcohol 100% now for past 2-3 weeks moving forward plans to abstain. Taking Pantoprazole 20mg  daily most days before breakfast  Symptoms of GERD controlled Denies any abdominal pain, nausea vomiting dark stools  Seasonal / Environmental Allergies: Reports chronic problem usually Spring and late Summer On Cetirizine vs Claritin OTC daily  Lifestyle Currently working on exercise regimen, goal to gain some muscle mass, goal to gain a few lbs Trying to gain weight. +3 lbs Some protein shake supplement Staying active Biking at work regularly Weight training  Additional concern  Tinea veriscolor / skin fungal spots Several areas on back in past with patchy spots of skin with fungal rash. No symptoms. Not itching or pain. Has used topical Ketoconazole in past with good results. Only mild now. Worse in summer with heat  Health Maintenance: UTD COVID19 vaccine Pfizer  Age 61, not indicated for early colon or prostate CA Screening. No known early family history of these cancers that would suggest earlier testing.  He has questions today on genetic cancers, also has questions on pancreatic cancer, given co worker was diagnosed.  Depression screen [facility] 2/9 04/09/2020 03/13/2020 08/30/2018  Decreased Interest 0 0 0  Down, Depressed, Hopeless 0 0 0  PHQ - 2 Score 0 0 0    No flowsheet data found.   Past Medical History:  Diagnosis Date  . GERD (gastroesophageal reflux disease)    No past surgical history on file. Social History   Socioeconomic History  . Marital status: Single    Spouse name: Not on file  . Number of children: Not on file  . Years of education: Not on file  . Highest education level: Not on file  Occupational History  . 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 Smoking status: Former Smoker    Types: Cigarettes    Quit date: 06/25/2014    Years since quitting: 5.7  . Smokeless tobacco: Former 08/25/2014 and Sexual Activity  . Alcohol use: Yes  . Drug use: No  . Sexual activity: Yes    Birth control/protection: Inserts, Injection  Other Topics Concern  . Not on file  Social History Narrative  . Not on file   Social Determinants of Health   Financial Resource Strain:   . Difficulty of Paying Living Expenses:   Food Insecurity:   . Worried About Engineer, water in the Last Year:   . Programme researcher, broadcasting/film/video in the Last Year:   Transportation Needs:   . Barista (Medical):   Freight forwarder Lack of Transportation (Non-Medical):   Physical Activity:   . Days of Exercise per Week:   . Minutes of Exercise per Session:   Stress:   . Feeling of Stress :   Social Connections:   . Frequency of Communication with Friends and  Family:   . Frequency of Social Gatherings with Friends and Family:   . Attends Religious Services:   . Active Member of Clubs or Organizations:   . Attends Archivist Meetings:   Marland Kitchen Marital Status:   Intimate Partner Violence:   . Fear of Current or Ex-Partner:   . Emotionally Abused:   Marland Kitchen Physically Abused:   . Sexually Abused:    Family History  Problem Relation Age of Onset  . Heart disease Maternal Uncle   . Hypertension Maternal Uncle   . Diabetes Maternal Aunt   . Prostate cancer Neg Hx   . Colon cancer Neg Hx    Current Outpatient Medications on File Prior  to Visit  Medication Sig  . fexofenadine (ALLEGRA) 60 MG tablet Take 60 mg by mouth daily.  . pantoprazole (PROTONIX) 20 MG tablet Take 1 tablet (20 mg total) by mouth daily before breakfast.   No current facility-administered medications on file prior to visit.    Review of Systems  Constitutional: Negative for activity change, appetite change, chills, diaphoresis, fatigue and fever.  HENT: Negative for congestion and hearing loss.   Eyes: Negative for visual disturbance.  Respiratory: Negative for apnea, cough, chest tightness, shortness of breath and wheezing.   Cardiovascular: Negative for chest pain, palpitations and leg swelling.  Gastrointestinal: Negative for abdominal pain, anal bleeding, blood in stool, constipation, diarrhea, nausea and vomiting.  Endocrine: Negative for cold intolerance.  Genitourinary: Negative for decreased urine volume, difficulty urinating, dysuria, frequency, hematuria, testicular pain and urgency.  Musculoskeletal: Negative for arthralgias, back pain and neck pain.  Skin: Negative for rash.  Allergic/Immunologic: Negative for environmental allergies.  Neurological: Negative for dizziness, weakness, light-headedness, numbness and headaches.  Hematological: Negative for adenopathy.  Psychiatric/Behavioral: Negative for behavioral problems, dysphoric mood and sleep disturbance. The patient is not nervous/anxious.    Per HPI unless specifically indicated above      Objective:    BP 123/62   Pulse 75   Temp (!) 97.5 F (36.4 C) (Temporal)   Resp 16   Ht 5\' 11"  (1.803 m)   Wt 161 lb (73 kg)   BMI 22.45 kg/m   Wt Readings from Last 3 Encounters:  04/09/20 161 lb (73 kg)  03/13/20 158 lb (71.7 kg)  08/30/18 154 lb (69.9 kg)    Physical Exam Vitals and nursing note reviewed.  Constitutional:      General: He is not in acute distress.    Appearance: He is well-developed. He is not diaphoretic.     Comments: Well-appearing, comfortable,  cooperative  HENT:     Head: Normocephalic and atraumatic.  Eyes:     General:        Right eye: No discharge.        Left eye: No discharge.     Conjunctiva/sclera: Conjunctivae normal.     Pupils: Pupils are equal, round, and reactive to light.  Neck:     Thyroid: No thyromegaly.  Cardiovascular:     Rate and Rhythm: Normal rate and regular rhythm.     Heart sounds: Normal heart sounds. No murmur.  Pulmonary:     Effort: Pulmonary effort is normal. No respiratory distress.     Breath sounds: Normal breath sounds. No wheezing or rales.  Abdominal:     General: Bowel sounds are normal. There is no distension.     Palpations: Abdomen is soft. There is no mass.     Tenderness: There is no abdominal tenderness.  Musculoskeletal:        General: No tenderness. Normal range of motion.     Cervical back: Normal range of motion and neck supple.     Comments: Upper / Lower Extremities: - Normal muscle tone, strength bilateral upper extremities 5/5, lower extremities 5/5  Lymphadenopathy:     Cervical: No cervical adenopathy.  Skin:    General: Skin is warm and dry.     Findings: No erythema or rash.  Neurological:     Mental Status: He is alert and oriented to person, place, and time.     Comments: Distal sensation intact to light touch all extremities  Psychiatric:        Behavior: Behavior normal.     Comments: Well groomed, good eye contact, normal speech and thoughts       Results for orders placed or performed in visit on 04/02/20  Lipid panel  Result Value Ref Range   Cholesterol 131 <200 mg/dL   HDL 42 > OR = 40 mg/dL   Triglycerides 48 <102 mg/dL   LDL Cholesterol (Calc) 75 mg/dL (calc)   Total CHOL/HDL Ratio 3.1 <5.0 (calc)   Non-HDL Cholesterol (Calc) 89 <585 mg/dL (calc)  COMPLETE METABOLIC PANEL WITH GFR  Result Value Ref Range   Glucose, Bld 98 65 - 99 mg/dL   BUN 18 7 - 25 mg/dL   Creat 2.77 8.24 - 2.35 mg/dL   GFR, Est Non African American 102 > OR = 60  mL/min/1.39m2   GFR, Est African American 119 > OR = 60 mL/min/1.45m2   BUN/Creatinine Ratio NOT APPLICABLE 6 - 22 (calc)   Sodium 136 135 - 146 mmol/L   Potassium 4.5 3.5 - 5.3 mmol/L   Chloride 105 98 - 110 mmol/L   CO2 25 20 - 32 mmol/L   Calcium 9.5 8.6 - 10.3 mg/dL   Total Protein 7.1 6.1 - 8.1 g/dL   Albumin 4.3 3.6 - 5.1 g/dL   Globulin 2.8 1.9 - 3.7 g/dL (calc)   AG Ratio 1.5 1.0 - 2.5 (calc)   Total Bilirubin 0.4 0.2 - 1.2 mg/dL   Alkaline phosphatase (APISO) 49 36 - 130 U/L   AST 43 (H) 10 - 40 U/L   ALT 39 9 - 46 U/L  CBC with Differential/Platelet  Result Value Ref Range   WBC 6.5 3.8 - 10.8 Thousand/uL   RBC 4.47 4.20 - 5.80 Million/uL   Hemoglobin 13.1 (L) 13.2 - 17.1 g/dL   HCT 36.1 44.3 - 15.4 %   MCV 87.2 80.0 - 100.0 fL   MCH 29.3 27.0 - 33.0 pg   MCHC 33.6 32.0 - 36.0 g/dL   RDW 00.8 67.6 - 19.5 %   Platelets 325 140 - 400 Thousand/uL   MPV 10.2 7.5 - 12.5 fL   Neutro Abs 3,400 1,500 - 7,800 cells/uL   Lymphs Abs 1,788 850 - 3,900 cells/uL   Absolute Monocytes 839 200 - 950 cells/uL   Eosinophils Absolute 397 15 - 500 cells/uL   Basophils Absolute 78 0 - 200 cells/uL   Neutrophils Relative % 52.3 %   Total Lymphocyte 27.5 %   Monocytes Relative 12.9 %   Eosinophils Relative 6.1 %   Basophils Relative 1.2 %  Hemoglobin A1c  Result Value Ref Range   Hgb A1c MFr Bld 5.0 <5.7 % of total Hgb   Mean Plasma Glucose 97 (calc)   eAG (mmol/L) 5.4 (calc)      Assessment & Plan:   Problem List  Items Addressed This Visit    Normocytic anemia   GERD without esophagitis    Other Visit Diagnoses    Annual physical exam    -  Primary   Elevated AST (SGOT)         Updated Health Maintenance information - Not due for other cancer screening at this time. Reassurance, reviewed routine screening testing protocol, typically age 37+ for considering cancer screening, routinely age 57 + for colon prostate - Handout on [facility] website can check into this for  hereditary cancer screening if interested Reviewed recent lab results with patient Encouraged improvement to lifestyle with diet and exercise Maintain healthy weight  #GERD Controlled on PPI  #Elevated AST LFT New isolated very mild abnormal result AST 43, prior range LFTs normal. Suspect based on history this is most directly related to increased alcohol intake in the past 12+ months, now he is abstaining from alcohol and suspect this is resolving. - No elevated cholesterol - Will repeat Hepatic function panel in 3 months off alcohol - Future consider more broad approach for LFT work up if further elevation or new concern, consider RUQ [place] and other lab testing if indicated   No orders of the defined types were placed in this encounter.    Follow up plan: Return in about 3 months (around 07/10/2020) for 3 month fasting lab only then follow-up in 1 year from now 03/2021 for annual.   Repeat Hepatic fxn panel for LFTs in 3 months, then future can do other testing if indicated. Follow-up in 1 year then as anticipated if no other concern  Saralyn Pilar, DO [facility] 04/09/2020, 9:14 AM

## 2020-04-09 NOTE — Patient Instructions (Addendum)
Thank you for coming to the office today.  Overall you are doing well  [email or web address].[email or web address]/[email or web address]   DUE for FASTING BLOOD WORK (no food or drink after midnight before the lab appointment, only water or coffee without cream/sugar on the morning of)  SCHEDULE "Lab Only" visit in the morning at the clinic for lab draw in 3 months for liver enzyme  For Lab Results, once available within 2-3 days of blood draw, you can can log in to MyChart online to view your results and a brief explanation. Also, we can discuss results at next follow-up visit.  Please schedule a Follow-up Appointment to: Return in about 3 months (around 07/10/2020) for 3 month fasting lab only then follow-up in 1 year from now 03/2021 for annual.  If you have any other questions or concerns, please feel free to call the office or send a message through MyChart. You may also schedule an earlier appointment if necessary.  Additionally, you may be receiving a survey about your experience at our office within a few days to 1 week by e-mail or mail. We value your feedback.  Saralyn Pilar, DO [facility], [place]

## 2020-07-05 DIAGNOSIS — Z113 Encounter for screening for infections with a predominantly sexual mode of transmission: Secondary | ICD-10-CM

## 2020-07-05 DIAGNOSIS — R7401 Elevation of levels of liver transaminase levels: Secondary | ICD-10-CM

## 2020-07-06 ENCOUNTER — Other Ambulatory Visit (HOSPITAL_COMMUNITY)
Admission: RE | Admit: 2020-07-06 | Discharge: 2020-07-06 | Disposition: A | Discharge status: Home / Self Care | Payer: 59 | Source: Ambulatory Visit | Attending: Family Medicine | Admitting: Family Medicine

## 2020-07-06 DIAGNOSIS — Z113 Encounter for screening for infections with a predominantly sexual mode of transmission: Secondary | ICD-10-CM | POA: Insufficient documentation

## 2020-07-06 NOTE — Addendum Note (Signed)
Addended by: Smitty Cords on: 07/06/2020 08:10 AM   Modules accepted: Orders

## 2020-07-09 ENCOUNTER — Other Ambulatory Visit: Payer: 59

## 2020-07-10 LAB — HEPATIC FUNCTION PANEL
AG Ratio: 1.4 (calc) (ref 1.0–2.5)
ALT: 25 U/L (ref 9–46)
AST: 18 U/L (ref 10–40)
Albumin: 4.5 g/dL (ref 3.6–5.1)
Alkaline phosphatase (APISO): 59 U/L (ref 36–130)
Bilirubin, Direct: 0.1 mg/dL (ref 0.0–0.2)
Globulin: 3.2 g/dL (calc) (ref 1.9–3.7)
Indirect Bilirubin: 0.4 mg/dL (calc) (ref 0.2–1.2)
Total Bilirubin: 0.5 mg/dL (ref 0.2–1.2)
Total Protein: 7.7 g/dL (ref 6.1–8.1)

## 2020-07-10 LAB — HIV ANTIBODY (ROUTINE TESTING W REFLEX): HIV 1&2 Ab, 4th Generation: NONREACTIVE

## 2020-07-10 LAB — RPR: RPR Ser Ql: NONREACTIVE

## 2020-07-11 ENCOUNTER — Other Ambulatory Visit: Payer: Self-pay

## 2020-07-11 LAB — GC/CHLAMYDIA PROBE AMP (~~LOC~~) NOT AT ARMC
Chlamydia: NEGATIVE
Comment: NEGATIVE
Comment: NORMAL
Neisseria Gonorrhea: NEGATIVE

## 2021-01-24 ENCOUNTER — Other Ambulatory Visit: Payer: Self-pay

## 2021-01-24 ENCOUNTER — Ambulatory Visit
Admission: RE | Admit: 2021-01-24 | Discharge: 2021-01-24 | Disposition: A | Discharge status: Home / Self Care | Payer: Worker's Compensation | Source: Ambulatory Visit | Attending: Nurse Practitioner | Admitting: Nurse Practitioner

## 2021-01-24 ENCOUNTER — Other Ambulatory Visit: Payer: Self-pay | Admitting: Nurse Practitioner

## 2021-01-24 DIAGNOSIS — M25561 Pain in right knee: Secondary | ICD-10-CM

## 2021-03-20 ENCOUNTER — Other Ambulatory Visit: Payer: Self-pay | Admitting: Family Medicine

## 2021-03-20 DIAGNOSIS — K219 Gastro-esophageal reflux disease without esophagitis: Secondary | ICD-10-CM

## 2021-03-20 NOTE — Telephone Encounter (Signed)
Requested medications are due for refill today.  Yes  Requested medications are on the active medications list.  yes  Last refill. 03/13/2020  Future visit scheduled.   yes  Notes to clinic.  Prescription is expired.

## 2021-04-16 ENCOUNTER — Encounter: Payer: 59 | Admitting: Family Medicine

## 2021-04-19 ENCOUNTER — Encounter: Payer: 59 | Admitting: Family Medicine

## 2021-04-24 ENCOUNTER — Encounter: Payer: 59 | Admitting: Family Medicine

## 2021-04-25 ENCOUNTER — Ambulatory Visit (INDEPENDENT_AMBULATORY_CARE_PROVIDER_SITE_OTHER): Payer: 59 | Admitting: Family Medicine

## 2021-04-25 ENCOUNTER — Encounter: Payer: Self-pay | Admitting: Family Medicine

## 2021-04-25 ENCOUNTER — Other Ambulatory Visit: Payer: Self-pay

## 2021-04-25 VITALS — BP 126/70 | HR 69 | Ht 70.0 in | Wt 163.8 lb

## 2021-04-25 DIAGNOSIS — Z0000 Encounter for general adult medical examination without abnormal findings: Secondary | ICD-10-CM | POA: Diagnosis not present

## 2021-04-25 DIAGNOSIS — K219 Gastro-esophageal reflux disease without esophagitis: Secondary | ICD-10-CM | POA: Diagnosis not present

## 2021-04-25 DIAGNOSIS — Z1159 Encounter for screening for other viral diseases: Secondary | ICD-10-CM | POA: Diagnosis not present

## 2021-04-25 NOTE — Patient Instructions (Addendum)
Thank you for coming to the office today.  Pantoprazole 20mg  ordered 02/2021 it is good for 1 year.  Keep up the great work overall  DUE for FASTING BLOOD WORK (no food or drink after midnight before the lab appointment, only water or coffee without cream/sugar on the morning of)  SCHEDULE "Lab Only" visit in the morning at the clinic for lab draw in 1 day  - Make sure Lab Only appointment is at about 1 week before your next appointment, so that results will be available  For Lab Results, once available within 2-3 days of blood draw, you can can log in to MyChart online to view your results and a brief explanation. Also, we can discuss results at next follow-up visit.   Please schedule a Follow-up Appointment to: Return in about 1 year (around 04/25/2022) for return 1 day for fasting lab only 6/3, then can return for Year fasting lab only + Annual physical.  If you have any other questions or concerns, please feel free to call the office or send a message through MyChart. You may also schedule an earlier appointment if necessary.  Additionally, you may be receiving a survey about your experience at our office within a few days to 1 week by e-mail or mail. We value your feedback.  8/3, DO [facility], [facility]

## 2021-04-25 NOTE — Progress Notes (Signed)
Subjective:    Patient ID: Alexander Ball, male    DOB: [birth year], 35 y.o.   MRN: [id number]  Pistol Kessenich is a 35 y.o. male presenting on 04/25/2021 for Annual Exam   HPI   Here for Annual Physical, due for fasting lab tomorrow.  GERD Elevated LFT AST - resolved on last 2 labs. normalized Dramatic reduced alcohol Taking Pantoprazole 20mg  daily most days before breakfast  Symptoms of GERD controlled Denies any abdominal pain, nausea vomiting dark stools  Seasonal / Environmental Allergies: Reports chronic problem usually Spring and late Summer On OTC anti histamine.  Lifestyle Currently working on exercise regimen Weight stable Some protein shake supplement Staying active Weight training  Additional concern  Right Knee Chronic Pain He has followed with [facility]. Had MRI, negative without tear or injury, had some fluid, has improved. But still has some problems.   Health Maintenance: UTD COVID19 vaccine Pfizer. He has not received COVID booster yet, he had covid 11/2020, he is considering the booster in the near future.  Age 26, not indicated for early colon or prostate CA Screening. No known early family history of these cancers that would suggest earlier testing.   Depression screen [facility] 2/9 04/25/2021 04/09/2020 03/13/2020  Decreased Interest 0 0 0  Down, Depressed, Hopeless 0 0 0  PHQ - 2 Score 0 0 0  Altered sleeping 0 - -  Tired, decreased energy 0 - -  Change in appetite 0 - -  Feeling bad or failure about yourself  0 - -  Trouble concentrating 0 - -  Moving slowly or fidgety/restless 0 - -  Suicidal thoughts 0 - -  PHQ-9 Score 0 - -  Difficult doing work/chores Not difficult at all - -    Past Medical History:  Diagnosis Date  . GERD (gastroesophageal reflux disease)    History reviewed. No pertinent surgical history. Social History   Socioeconomic History  . Marital status: Single    Spouse name: Not on file  . Number  of children: Not on file  . Years of education: Not on file  . Highest education level: Not on file  Occupational History  . 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 Smoking status: Former Smoker    Types: Cigarettes    Quit date: 06/25/2014    Years since quitting: 6.8  . Smokeless tobacco: Former 08/25/2014  . Vaping Use: Never used  Substance and Sexual Activity  . Alcohol use: Yes    Comment: 2-4 times a month   . Drug use: No  . Sexual activity: Yes    Birth control/protection: Inserts, Injection  Other Topics Concern  . Not on file  Social History Narrative  . Not on file   Social Determinants of Health   Financial Resource Strain: Not on file  Food Insecurity: Not on file  Transportation Needs: Not on file  Physical Activity: Not on file  Stress: Not on file  Social Connections: Not on file  Intimate Partner Violence: Not on file   Family History  Problem Relation Age of Onset  . Heart disease Maternal Uncle   . Hypertension Maternal Uncle   . Diabetes Maternal Aunt   . Prostate cancer Neg Hx   . Colon cancer Neg Hx    Current Outpatient Medications on File Prior to Visit  Medication Sig  . fexofenadine (ALLEGRA) 60 MG tablet Take 60 mg by mouth daily.  . pantoprazole (PROTONIX) 20 MG  tablet TAKE 1 TABLET BY MOUTH  DAILY BEFORE BREAKFAST   No current facility-administered medications on file prior to visit.    Review of Systems  Constitutional: Negative for activity change, appetite change, chills, diaphoresis, fatigue and fever.  HENT: Negative for congestion and hearing loss.   Eyes: Negative for visual disturbance.  Respiratory: Negative for cough, chest tightness, shortness of breath and wheezing.   Cardiovascular: Negative for chest pain, palpitations and leg swelling.  Gastrointestinal: Negative for abdominal pain, constipation, diarrhea, nausea and vomiting.  Genitourinary: Negative for dysuria, frequency and hematuria.   Musculoskeletal: Negative for arthralgias and neck pain.  Skin: Negative for rash.  Neurological: Negative for dizziness, weakness, light-headedness, numbness and headaches.  Hematological: Negative for adenopathy.  Psychiatric/Behavioral: Negative for behavioral problems, dysphoric mood and sleep disturbance.   Per HPI unless specifically indicated above      Objective:    BP 126/70   Pulse 69   Ht 5\' 10"  (1.778 m)   Wt 163 lb 12.8 oz (74.3 kg)   SpO2 99%   BMI 23.50 kg/m   Wt Readings from Last 3 Encounters:  04/25/21 163 lb 12.8 oz (74.3 kg)  04/09/20 161 lb (73 kg)  03/13/20 158 lb (71.7 kg)    Physical Exam Vitals and nursing note reviewed.  Constitutional:      General: He is not in acute distress.    Appearance: He is well-developed. He is not diaphoretic.     Comments: Well-appearing, comfortable, cooperative  HENT:     Head: Normocephalic and atraumatic.     Right Ear: Tympanic membrane, ear canal and external ear normal. There is no impacted cerumen.     Left Ear: Tympanic membrane, ear canal and external ear normal. There is no impacted cerumen.  Eyes:     General:        Right eye: No discharge.        Left eye: No discharge.     Conjunctiva/sclera: Conjunctivae normal.     Pupils: Pupils are equal, round, and reactive to light.  Neck:     Thyroid: No thyromegaly.     Vascular: No carotid bruit.  Cardiovascular:     Rate and Rhythm: Normal rate and regular rhythm.     Heart sounds: Normal heart sounds. No murmur heard.   Pulmonary:     Effort: Pulmonary effort is normal. No respiratory distress.     Breath sounds: Normal breath sounds. No wheezing or rales.  Abdominal:     General: Bowel sounds are normal. There is no distension.     Palpations: Abdomen is soft. There is no mass.     Tenderness: There is no abdominal tenderness.  Musculoskeletal:        General: No tenderness. Normal range of motion.     Cervical back: Normal range of motion and  neck supple.     Comments: Upper / Lower Extremities: - Normal muscle tone, strength bilateral upper extremities 5/5, lower extremities 5/5  Lymphadenopathy:     Cervical: No cervical adenopathy.  Skin:    General: Skin is warm and dry.     Findings: No erythema or rash.  Neurological:     Mental Status: He is alert and oriented to person, place, and time.     Comments: Distal sensation intact to light touch all extremities  Psychiatric:        Behavior: Behavior normal.     Comments: Well groomed, good eye contact, normal speech and thoughts  Results for orders placed or performed in visit on 07/05/20  HIV Antibody (routine testing w rflx)  Result Value Ref Range   HIV 1&2 Ab, 4th Generation NON-REACTIVE NON-REACTI  RPR  Result Value Ref Range   RPR Ser Ql NON-REACTIVE NON-REACTI  Hepatic function panel  Result Value Ref Range   Total Protein 7.7 6.1 - 8.1 g/dL   Albumin 4.5 3.6 - 5.1 g/dL   Globulin 3.2 1.9 - 3.7 g/dL (calc)   AG Ratio 1.4 1.0 - 2.5 (calc)   Total Bilirubin 0.5 0.2 - 1.2 mg/dL   Bilirubin, Direct 0.1 0.0 - 0.2 mg/dL   Indirect Bilirubin 0.4 0.2 - 1.2 mg/dL (calc)   Alkaline phosphatase (APISO) 59 36 - 130 U/L   AST 18 10 - 40 U/L   ALT 25 9 - 46 U/L  GC/Chlamydia probe amp ([place])not at [facility]  Result Value Ref Range   Neisseria Gonorrhea Negative    Chlamydia Negative    Comment Normal Reference Ranger Chlamydia - Negative    Comment      Normal Reference Range Neisseria Gonorrhea - Negative      Assessment & Plan:   Problem List Items Addressed This Visit    GERD without esophagitis    Other Visit Diagnoses    Annual physical exam    -  Primary   Relevant Orders   CBC with Differential/Platelet   COMPLETE METABOLIC PANEL WITH GFR   Lipid panel   Need for hepatitis C screening test       Relevant Orders   Hepatitis C antibody      Updated Health Maintenance information Proceed w/ COVID 1st booster when ready. Return tomorrow  fasting lab, f/u results. Add Hep C screening lab. Encouraged improvement to lifestyle with diet and exercise Goal maintain healthy weight.  #GERD Continue low dose PPI Pantoprazole 20mg  daily, counseling on future may consider taper off trial, avoid rebound etc. Or can continue longer term.  No orders of the defined types were placed in this encounter.    Follow up plan: Return in about 1 year (around 04/25/2022) for return 1 day for fasting lab only 6/3, then can return for Year fasting lab only + Annual physical.  8/3, DO [facility] 04/25/2021, 9:40 AM

## 2021-04-26 ENCOUNTER — Other Ambulatory Visit: Payer: 59

## 2021-04-26 DIAGNOSIS — Z0000 Encounter for general adult medical examination without abnormal findings: Secondary | ICD-10-CM

## 2021-04-26 DIAGNOSIS — Z1159 Encounter for screening for other viral diseases: Secondary | ICD-10-CM

## 2021-04-29 LAB — COMPLETE METABOLIC PANEL WITH GFR
AG Ratio: 1.5 (calc) (ref 1.0–2.5)
ALT: 17 U/L (ref 9–46)
AST: 17 U/L (ref 10–40)
Albumin: 4.4 g/dL (ref 3.6–5.1)
Alkaline phosphatase (APISO): 54 U/L (ref 36–130)
BUN: 15 mg/dL (ref 7–25)
CO2: 27 mmol/L (ref 20–32)
Calcium: 9.5 mg/dL (ref 8.6–10.3)
Chloride: 104 mmol/L (ref 98–110)
Creat: 1.03 mg/dL (ref 0.60–1.35)
GFR, Est African American: 111 mL/min/{1.73_m2} (ref >=60)
GFR, Est Non African American: 96 mL/min/{1.73_m2} (ref >=60)
Globulin: 3 g/dL (calc) (ref 1.9–3.7)
Glucose, Bld: 94 mg/dL (ref 65–99)
Potassium: 4.3 mmol/L (ref 3.5–5.3)
Sodium: 138 mmol/L (ref 135–146)
Total Bilirubin: 0.4 mg/dL (ref 0.2–1.2)
Total Protein: 7.4 g/dL (ref 6.1–8.1)

## 2021-04-29 LAB — CBC WITH DIFFERENTIAL/PLATELET
Absolute Monocytes: 796 cells/uL (ref 200–950)
Basophils Absolute: 88 cells/uL (ref 0–200)
Basophils Relative: 1.3 %
Eosinophils Absolute: 544 cells/uL — ABNORMAL HIGH (ref 15–500)
Eosinophils Relative: 8 %
HCT: 41.9 % (ref 38.5–50.0)
Hemoglobin: 13.6 g/dL (ref 13.2–17.1)
Lymphs Abs: 2441 cells/uL (ref 850–3900)
MCH: 28.8 pg (ref 27.0–33.0)
MCHC: 32.5 g/dL (ref 32.0–36.0)
MCV: 88.8 fL (ref 80.0–100.0)
MPV: 10 fL (ref 7.5–12.5)
Monocytes Relative: 11.7 %
Neutro Abs: 2931 cells/uL (ref 1500–7800)
Neutrophils Relative %: 43.1 %
Platelets: 300 10*3/uL (ref 140–400)
RBC: 4.72 10*6/uL (ref 4.20–5.80)
RDW: 12.6 % (ref 11.0–15.0)
Total Lymphocyte: 35.9 %
WBC: 6.8 10*3/uL (ref 3.8–10.8)

## 2021-04-29 LAB — LIPID PANEL
Cholesterol: 143 mg/dL (ref <200)
HDL: 54 mg/dL (ref >=40)
LDL Cholesterol (Calc): 76 mg/dL (calc)
Non-HDL Cholesterol (Calc): 89 mg/dL (calc) (ref <130)
Total CHOL/HDL Ratio: 2.6 (calc) (ref <5.0)
Triglycerides: 51 mg/dL (ref <150)

## 2021-04-29 LAB — HEPATITIS C ANTIBODY
Hepatitis C Ab: NONREACTIVE
SIGNAL TO CUT-OFF: 0.02 (ref <1.00)

## 2022-02-10 ENCOUNTER — Encounter: Payer: Self-pay | Admitting: Family Medicine

## 2022-02-10 ENCOUNTER — Ambulatory Visit: Payer: 59 | Admitting: Family Medicine

## 2022-02-10 ENCOUNTER — Other Ambulatory Visit: Payer: Self-pay

## 2022-02-10 ENCOUNTER — Ambulatory Visit
Admission: RE | Admit: 2022-02-10 | Discharge: 2022-02-10 | Disposition: A | Discharge status: Home / Self Care | Payer: 59 | Attending: Family Medicine | Admitting: Family Medicine

## 2022-02-10 ENCOUNTER — Ambulatory Visit
Admission: RE | Admit: 2022-02-10 | Discharge: 2022-02-10 | Disposition: A | Discharge status: Home / Self Care | Payer: 59 | Source: Ambulatory Visit | Attending: Family Medicine | Admitting: Family Medicine

## 2022-02-10 VITALS — BP 112/68 | HR 70 | Ht 70.0 in | Wt 166.2 lb

## 2022-02-10 DIAGNOSIS — G8929 Other chronic pain: Secondary | ICD-10-CM

## 2022-02-10 DIAGNOSIS — M25561 Pain in right knee: Secondary | ICD-10-CM

## 2022-02-10 DIAGNOSIS — M79672 Pain in left foot: Secondary | ICD-10-CM | POA: Diagnosis present

## 2022-02-10 DIAGNOSIS — M79644 Pain in right finger(s): Secondary | ICD-10-CM | POA: Diagnosis present

## 2022-02-10 NOTE — Patient Instructions (Addendum)
Thank you for coming to the office today. ? ?START anti inflammatory topical - OTC Voltaren (generic Diclofenac) topical 2-4 times a day as needed for pain swelling of affected joint for 1-2 weeks or longer. ? ?X-ray of Left foot today, we will rule out stress fracture. ? ?-------------- ? ? Sports Medicine ? ?Dr Joseph Berkshire ? ?[facility] ?[address] ?[address] ?[place], [place] [address] ?[phone] ? ?Can take Tylenol instead. ? ?Please schedule a Follow-up Appointment to: Return if symptoms worsen or fail to improve. ? ?If you have any other questions or concerns, please feel free to call the office or send a message through MyChart. You may also schedule an earlier appointment if necessary. ? ?Additionally, you may be receiving a survey about your experience at our office within a few days to 1 week by e-mail or mail. We value your feedback. ? ?Saralyn Pilar, DO ?[facility], [place] ?

## 2022-02-10 NOTE — Progress Notes (Signed)
? ?Subjective:  ? ? Patient ID: Alexander Ball, male    DOB: Dec 18, 1985, 36 y.o.   MRN: [id number] ? ?Alexander Ball is a 36 y.o. male presenting on 02/10/2022 for Hand Pain ? ? ?HPI ? ?Left Foot Pain ?R Knee Pain ?Reports initial acute injury, stepped out of vehicle onto curb 1 year ago April 2022, with some moderate injury and pain at that time. No further management or evaluation, no x-ray, seemed to improve and then has had recurrent flares since then. He says over past 1 year with has episodic pain and symptoms, but mild and not too significant but has flares with increased activity ? ?He had issue with knees, that triggered him to go to [facility] in past year, they did evaluation on the knee but no significant problem with the L foot at that time, he still has episodic R knee pain and had some fluid in the joint that has resolved. ? ?Increased symptoms and flare with mileage and cycling. If he is sedentary he is having less pain and flare ups. ? ?Taking Advil PRN. ? ?He admits episodic symptoms usually flare at times ? ? ?Right Index finger Pain ?Right Handed ?Reports a gradual onset without acute injury over past 2 months+ , he thought maybe pull ups could have caused as a possible theory. ? ?Pain at MCP and PIP worse with flexion, also pain with grip and twisting with flexion movement. ?Admits grip is painful mild but worse if actual gripping something. ? ? ? ?Depression screen [facility] 2/9 04/25/2021 04/09/2020 03/13/2020  ?Decreased Interest 0 0 0  ?Down, Depressed, Hopeless 0 0 0  ?PHQ - 2 Score 0 0 0  ?Altered sleeping 0 - -  ?Tired, decreased energy 0 - -  ?Change in appetite 0 - -  ?Feeling bad or failure about yourself  0 - -  ?Trouble concentrating 0 - -  ?Moving slowly or fidgety/restless 0 - -  ?Suicidal thoughts 0 - -  ?PHQ-9 Score 0 - -  ?Difficult doing work/chores Not difficult at all - -  ? ? ?Social History  ? ?Tobacco Use  ? Smoking status: Former  ?  Types: Cigarettes  ?  Quit  date: 06/25/2014  ?  Years since quitting: 7.6  ? Smokeless tobacco: Former  ?Vaping Use  ? Vaping Use: Never used  ?Substance Use Topics  ? Alcohol use: Yes  ?  Comment: 2-4 times a month   ? Drug use: No  ? ? ?Review of Systems ?Per HPI unless specifically indicated above ? ?   ?Objective:  ?  ?BP 112/68   Pulse 70   Ht 5\' 10"  (1.778 m)   Wt 166 lb 3.2 oz (75.4 kg)   SpO2 100%   BMI 23.85 kg/m?   ?Wt Readings from Last 3 Encounters:  ?02/10/22 166 lb 3.2 oz (75.4 kg)  ?04/25/21 163 lb 12.8 oz (74.3 kg)  ?04/09/20 161 lb (73 kg)  ?  ?Physical Exam ?Vitals and nursing note reviewed.  ?Constitutional:   ?   General: He is not in acute distress. ?   Appearance: Normal appearance. He is well-developed. He is not diaphoretic.  ?   Comments: Well-appearing, comfortable, cooperative  ?HENT:  ?   Head: Normocephalic and atraumatic.  ?Eyes:  ?   General:     ?   Right eye: No discharge.     ?   Left eye: No discharge.  ?   Conjunctiva/sclera: Conjunctivae normal.  ?  Cardiovascular:  ?   Rate and Rhythm: Normal rate.  ?Pulmonary:  ?   Effort: Pulmonary effort is normal.  ?Musculoskeletal:  ?   Comments: Feet appear normal. No flat feet or high arches. No edema or ecchymosis or erythema. Non tender to palpation dorsal medial aspect of MTP, seems has functional pain only provoked weight bearing ambulation on forefoot. ? ?R Index finger appears normal and symmetrical. No edema or erythema. Non tender. Provoked pain with grip only.  ?Skin: ?   General: Skin is warm and dry.  ?   Findings: No erythema or rash.  ?Neurological:  ?   Mental Status: He is alert and oriented to person, place, and time.  ?Psychiatric:     ?   Mood and Affect: Mood normal.     ?   Behavior: Behavior normal.     ?   Thought Content: Thought content normal.  ?   Comments: Well groomed, good eye contact, normal speech and thoughts  ? ? ? ? ?Results for orders placed or performed in visit on 04/26/21  ?Hepatitis C antibody  ?Result Value Ref Range  ?  Hepatitis C Ab NON-REACTIVE NON-REACTIVE  ? SIGNAL TO CUT-OFF 0.02 <1.00  ?Lipid panel  ?Result Value Ref Range  ? Cholesterol 143 <200 mg/dL  ? HDL 54 > OR = 40 mg/dL  ? Triglycerides 51 <150 mg/dL  ? LDL Cholesterol (Calc) 76 mg/dL (calc)  ? Total CHOL/HDL Ratio 2.6 <5.0 (calc)  ? Non-HDL Cholesterol (Calc) 89 <128 mg/dL (calc)  ?COMPLETE METABOLIC PANEL WITH GFR  ?Result Value Ref Range  ? Glucose, Bld 94 65 - 99 mg/dL  ? BUN 15 7 - 25 mg/dL  ? Creat 1.03 0.60 - 1.35 mg/dL  ? GFR, Est Non African American 96 > OR = 60 mL/min/1.72m2  ? GFR, Est African American 111 > OR = 60 mL/min/1.54m2  ? BUN/Creatinine Ratio NOT APPLICABLE 6 - 22 (calc)  ? Sodium 138 135 - 146 mmol/L  ? Potassium 4.3 3.5 - 5.3 mmol/L  ? Chloride 104 98 - 110 mmol/L  ? CO2 27 20 - 32 mmol/L  ? Calcium 9.5 8.6 - 10.3 mg/dL  ? Total Protein 7.4 6.1 - 8.1 g/dL  ? Albumin 4.4 3.6 - 5.1 g/dL  ? Globulin 3.0 1.9 - 3.7 g/dL (calc)  ? AG Ratio 1.5 1.0 - 2.5 (calc)  ? Total Bilirubin 0.4 0.2 - 1.2 mg/dL  ? Alkaline phosphatase (APISO) 54 36 - 130 U/L  ? AST 17 10 - 40 U/L  ? ALT 17 9 - 46 U/L  ?CBC with Differential/Platelet  ?Result Value Ref Range  ? WBC 6.8 3.8 - 10.8 Thousand/uL  ? RBC 4.72 4.20 - 5.80 Million/uL  ? Hemoglobin 13.6 13.2 - 17.1 g/dL  ? HCT 41.9 38.5 - 50.0 %  ? MCV 88.8 80.0 - 100.0 fL  ? MCH 28.8 27.0 - 33.0 pg  ? MCHC 32.5 32.0 - 36.0 g/dL  ? RDW 12.6 11.0 - 15.0 %  ? Platelets 300 140 - 400 Thousand/uL  ? MPV 10.0 7.5 - 12.5 fL  ? Neutro Abs 2,931 1,500 - 7,800 cells/uL  ? Lymphs Abs 2,441 850 - 3,900 cells/uL  ? Absolute Monocytes 796 200 - 950 cells/uL  ? Eosinophils Absolute 544 (H) 15 - 500 cells/uL  ? Basophils Absolute 88 0 - 200 cells/uL  ? Neutrophils Relative % 43.1 %  ? Total Lymphocyte 35.9 %  ? Monocytes Relative 11.7 %  ?  Eosinophils Relative 8.0 %  ? Basophils Relative 1.3 %  ? ?   ?Assessment & Plan:  ? ?Problem List Items Addressed This Visit   ?None ?Visit Diagnoses   ? ? Chronic foot pain, left    -  Primary  ?  Relevant Orders  ? DG Foot Complete Left  ? Finger pain, right      ? Relevant Orders  ? DG Finger Index Right  ? ?  ?  ?chronic >1 year Left foot dorsal medial forefoot pain, prior injury stepped wrong onto a curb 1 year ago but no other major injury, he experiences a lot of mileage with walking long hours and cycling daily at work. Still has pain with ambulation direct pressure on foot. Not having bruising or swelling. Also has R index finger MCP and PIP joint pain with grip worse. Lastly he has some chronic knee pain, has seen ortho before, had swelling of joint but no significant other management has improved. Seems to have variety of MSK related pains but he has demanding work physically - requesting consultation. X-rays done here to rule out stress fracture or other bony injury. ? ?START anti inflammatory topical - OTC Voltaren (generic Diclofenac) topical 2-4 times a day as needed for pain swelling of affected joint for 1-2 weeks or longer. ? ?X-ray of Left foot today, we will rule out stress fracture. ? ?-------------- ? ?[facility] ? ?Dr Joseph BerkshireJason Matthews ? ?[facility] ?[address] ?[address] ?[place], [place] [id number] ?[phone] ? ?Can take Tylenol instead. ? ? ?Orders Placed This Encounter  ?Procedures  ? DG Foot Complete Left  ?  Standing Status:   Future  ?  Number of Occurrences:   1  ?  Standing Expiration Date:   02/11/2023  ?  Order Specific Question:   Reason for Exam (SYMPTOM  OR DIAGNOSIS REQUIRED)  ?  Answer:   chronic left medial dorsal foot pain >1 year, original injury stepped on curb, episodic pain. r/o stress fracture  ?  Order Specific Question:   Preferred imaging location?  ?  Answer:   [facility]  ? DG Finger Index Right  ?  Standing Status:   Future  ?  Number of Occurrences:   1  ?  Standing Expiration Date:   02/11/2023  ?  Order Specific Question:   Reason for Exam (SYMPTOM  OR DIAGNOSIS REQUIRED)  ?  Answer:   Right index finger MCP PIP joints with pain  w/o swelling, no injury 2 months  ?  Order Specific Question:   Preferred imaging location?  ?  Answer:   [facility]  ? ? ? ?No orders of the defined types were placed in this encounter. ? ? ? ?Follow up pl

## 2022-02-17 ENCOUNTER — Encounter: Payer: Self-pay | Admitting: Family Medicine

## 2022-02-17 ENCOUNTER — Other Ambulatory Visit: Payer: Self-pay

## 2022-02-17 ENCOUNTER — Ambulatory Visit: Payer: 59 | Admitting: Family Medicine

## 2022-02-17 VITALS — BP 118/78 | HR 72 | Ht 70.0 in | Wt 167.2 lb

## 2022-02-17 DIAGNOSIS — S96092A Other injury of muscle and tendon of long flexor muscle of toe at ankle and foot level, left foot, initial encounter: Secondary | ICD-10-CM | POA: Diagnosis not present

## 2022-02-17 DIAGNOSIS — S66390A Other injury of extensor muscle, fascia and tendon of right index finger at wrist and hand level, initial encounter: Secondary | ICD-10-CM | POA: Insufficient documentation

## 2022-02-17 MED ORDER — MELOXICAM 15 MG PO TABS: 15.0000 mg | ORAL_TABLET | Freq: Every day | ORAL | 0 refills | Status: DC

## 2022-02-17 NOTE — Assessment & Plan Note (Addendum)
Right-hand-dominant patient presenting with atraumatic onset of right second digit pain for the past 2 months.  His pain is localized to the second MCP and PIP, aggravated with ADLs, opening jars, etc. he denies any swelling, relative overuse, paresthesias, prior injury, and has trialed Voltaren gel for 2 days with no significant noticeable difference. ? ?Examination shows full range of motion with pain during resisted extension localizing to the dorsal second MCP, subtle tendon subluxation appreciated, he is otherwise neurovascularly intact and with benign examination findings.  X-rays also look benign. ? 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  If suboptimal progress noted despite these measures, can discuss brief immobilization, advanced imaging, PT. ?

## 2022-02-17 NOTE — Assessment & Plan Note (Signed)
Greater than 1 year dorsal left great toe pain, onset traumatic after stepping off of a curb, was worse at onset, gradually has improved though not fully resolved, somewhat intermittent nature noted with ADLs, no paresthesias, no treatments today. ? ?Examination shows pain during maximal great toe flexion/extension, tenderness tracking along the extensor hallucis longus tendon, no subluxation, no tenderness at the joints, sensorimotor intact. ? ?Findings are most consistent with chronic tendinopathy, plan for Rx schedule meloxicam, home-based exercises, and close follow-up.  If suboptimal progress noted, can consider short-term immobilization, formal physical therapy, advanced imaging given the chronicity of symptoms. ? ?Chronic condition, symptomatic, Rx management, independent interpretation x-rays ?

## 2022-02-17 NOTE — Patient Instructions (Signed)
-   Start meloxicam once daily, take with food (no other NSAIDs while this medication) ?- Shutdown from upper body athletics x2 weeks, continue shutdown beyond 2 weeks if still symptomatic ?- Start home exercises for the foot ?- Return for follow-up in 4 weeks ?- Contact us for any questions between now and then ?

## 2022-02-17 NOTE — Progress Notes (Signed)
?  ? ?Primary Care / Sports Medicine Office Visit ? ?Patient Information:  ?Patient ID: Alexander Ball, male DOB: December 05, 1985 Age: 36 y.o. MRN: [id number]  ? ?Alexander Ball is a pleasant 36 y.o. male presenting with the following: ? ?Chief Complaint  ?Patient presents with  ? Foot Pain  ?  1 year, walking, hurts base of toes, has xray. No medicaiton  ? Hand Pain  ?  Right index finger, for 2 months both joints. Has xray, trying Voltaren gel, for the past 2 days  ? ? ?Vitals:  ? 02/17/22 0918  ?BP: 118/78  ?Pulse: 72  ?SpO2: 98%  ? ?Vitals:  ? 02/17/22 0918  ?Weight: 167 lb 3.2 oz (75.8 kg)  ?Height: 5\' 10"  (1.778 m)  ? ?Body mass index is 23.99 kg/m?. ? ?DG Finger Index Right ? ?Result Date: 02/12/2022 ?CLINICAL DATA:  Right index finger pain and swelling. No known injury. Two months. Pain at PIP joint and head of second metacarpal. EXAM: RIGHT INDEX FINGER 2+V COMPARISON:  None. FINDINGS: Normal bone mineralization. Joint spaces are preserved. No acute fracture is seen. No dislocation. There is incidental note of minimal chronic appearing mineralization at the proximal lateral aspect of the proximal phalanx of the third finger. This may represent the sequela of remote trauma/degenerative change. IMPRESSION: Normal right index finger radiographs. Electronically Signed   By: 02/14/2022 M.D.   On: 02/12/2022 18:33  ? ?DG Foot Complete Left ? ?Result Date: 02/12/2022 ?CLINICAL DATA:  Chronic medial dorsal foot pain for greater than 1 year. Evaluate for stress fracture. Left great toe and head of first metatarsal pain off and on for 1 year. EXAM: LEFT FOOT - COMPLETE 3+ VIEW COMPARISON:  None. FINDINGS: Normal bone mineralization. Joint spaces are preserved. No acute fracture is seen. No dislocation. IMPRESSION: No significant degenerative change.  No acute fracture. Electronically Signed   By: 02/14/2022 M.D.   On: 02/12/2022 18:32    ? ?Independent interpretation of notes and tests performed by  another provider:  ? ?Independent interpretation of right index finger x-ray reveals maintained joint spaces and alignment, there is incidentally noted proximal phalanx third digit cortical irregularity consistent with prior osseous injury, chronic in nature, no soft tissue shadow abnormalities visualized, no acute osseous processes noted. ? ?Independent interpretation of left foot x-rays reveals no degenerative processes, maintained alignment, no acute osseous injury noted. ? ?Procedures performed:  ? ?None ? ?Pertinent History, Exam, Impression, and Recommendations:  ? ?Other injury of extensor muscle, fascia and tendon of right index finger at wrist and hand level, initial encounter ?Right-hand-dominant patient presenting with atraumatic onset of right second digit pain for the past 2 months.  His pain is localized to the second MCP and PIP, aggravated with ADLs, opening jars, etc. he denies any swelling, relative overuse, paresthesias, prior injury, and has trialed Voltaren gel for 2 days with no significant noticeable difference. ? ?Examination shows full range of motion with pain during resisted extension localizing to the dorsal second MCP, subtle tendon subluxation appreciated, he is otherwise neurovascularly intact and with benign examination findings.  X-rays also look benign. ? 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  If suboptimal progress noted despite these measures, can discuss brief immobilization, advanced imaging, PT. ? ?Other injury of muscle  and tendon of long flexor muscle of toe at ankle and foot level, left foot, initial encounter ?Greater than 1 year dorsal left great toe pain, onset traumatic after stepping off of a curb, was worse at onset,  gradually has improved though not fully resolved, somewhat intermittent nature noted with ADLs, no paresthesias, no treatments today. ? ?Examination shows pain during maximal great toe flexion/extension, tenderness tracking along the extensor hallucis longus tendon, no subluxation, no tenderness at the joints, sensorimotor intact. ? ?Findings are most consistent with chronic tendinopathy, plan for Rx schedule meloxicam, home-based exercises, and close follow-up.  If suboptimal progress noted, can consider short-term immobilization, formal physical therapy, advanced imaging given the chronicity of symptoms. ? ?Chronic condition, symptomatic, Rx management, independent interpretation x-rays  ? ?Orders & Medications ?Meds ordered this encounter  ?Medications  ? meloxicam (MOBIC) 15 MG tablet  ?  Sig: Take 1 tablet (15 mg total) by mouth daily.  ?  Dispense:  30 tablet  ?  Refill:  0  ? ?No orders of the defined types were placed in this encounter. ?  ? ?Return in about 4 weeks (around 03/17/2022).  ?  ? ?Jerrol Banana, MD ? ? Primary Care Sports Medicine ?[facility] ?[facility] [place]  ? ?

## 2022-03-17 ENCOUNTER — Other Ambulatory Visit: Payer: Self-pay | Admitting: Family Medicine

## 2022-03-17 DIAGNOSIS — S66390A Other injury of extensor muscle, fascia and tendon of right index finger at wrist and hand level, initial encounter: Secondary | ICD-10-CM

## 2022-03-17 DIAGNOSIS — S96092A Other injury of muscle and tendon of long flexor muscle of toe at ankle and foot level, left foot, initial encounter: Secondary | ICD-10-CM

## 2022-03-18 NOTE — Telephone Encounter (Signed)
Patient scheduled f/u appt 03/20/22 . Will see if Dr. Zigmund Daniel wants to reorder or refill Rx? ?Requested Prescriptions  ?Refused Prescriptions Disp Refills  ?? meloxicam (MOBIC) 15 MG tablet [Pharmacy Med Name: MELOXICAM 15MG TABLETS] 30 tablet 0  ?  Sig: TAKE 1 TABLET(15 MG) BY MOUTH DAILY  ?  ? Analgesics:  COX2 Inhibitors Failed - 03/17/2022  3:30 AM  ?  ?  Failed - Manual Review: Labs are only required if the patient has taken medication for more than 8 weeks.  ?  ?  Passed - HGB in normal range and within 360 days  ?  Hemoglobin  ?Date Value Ref Range Status  ?04/26/2021 13.6 13.2 - 17.1 g/dL Final  ?06/27/2015 13.6 12.6 - 17.7 g/dL Final  ?   ?  ?  Passed - Cr in normal range and within 360 days  ?  Creat  ?Date Value Ref Range Status  ?04/26/2021 1.03 0.60 - 1.35 mg/dL Final  ?   ?  ?  Passed - HCT in normal range and within 360 days  ?  HCT  ?Date Value Ref Range Status  ?04/26/2021 41.9 38.5 - 50.0 % Final  ? ?Hematocrit  ?Date Value Ref Range Status  ?06/27/2015 39.8 37.5 - 51.0 % Final  ?   ?  ?  Passed - AST in normal range and within 360 days  ?  AST  ?Date Value Ref Range Status  ?04/26/2021 17 10 - 40 U/L Final  ?   ?  ?  Passed - ALT in normal range and within 360 days  ?  ALT  ?Date Value Ref Range Status  ?04/26/2021 17 9 - 46 U/L Final  ?   ?  ?  Passed - eGFR is 30 or above and within 360 days  ?  GFR, Est African American  ?Date Value Ref Range Status  ?04/26/2021 111 > OR = 60 mL/min/1.53m Final  ? ?GFR, Est Non African American  ?Date Value Ref Range Status  ?04/26/2021 96 > OR = 60 mL/min/1.743mFinal  ?   ?  ?  Passed - Patient is not pregnant  ?  ?  Passed - Valid encounter within last 12 months  ?  Recent Outpatient Visits   ?      ? 4 weeks ago Other injury of extensor muscle, fascia and tendon of right index finger at wrist and hand level, initial encounter  ? [facility]  ? 1 month ago Chronic foot pain, left  ? So[place]DO  ? 10 months ago Annual physical exam  ? So[place]DO  ? 1 year ago Annual physical exam  ? So[place]DO  ? 2 years ago GERD without esophagitis  ? [place]  ?  ?  ?Future Appointments   ?        ? In 2 days MaZigmund DanielJaEarley AbideMD [facility]? In 1 month Karamalegos, AlDevonne DoughtyDO [facility]?  ? ?  ?  ?  ? ?

## 2022-03-20 ENCOUNTER — Ambulatory Visit (INDEPENDENT_AMBULATORY_CARE_PROVIDER_SITE_OTHER): Payer: 59 | Admitting: Family Medicine

## 2022-03-20 ENCOUNTER — Encounter: Payer: Self-pay | Admitting: Family Medicine

## 2022-03-20 DIAGNOSIS — S96092A Other injury of muscle and tendon of long flexor muscle of toe at ankle and foot level, left foot, initial encounter: Secondary | ICD-10-CM

## 2022-03-20 DIAGNOSIS — S66390A Other injury of extensor muscle, fascia and tendon of right index finger at wrist and hand level, initial encounter: Secondary | ICD-10-CM | POA: Diagnosis not present

## 2022-03-20 MED ORDER — DICLOFENAC SODIUM 75 MG PO TBEC: 75.0000 mg | DELAYED_RELEASE_TABLET | Freq: Two times a day (BID) | ORAL | 0 refills | Status: DC | PRN

## 2022-03-20 NOTE — Assessment & Plan Note (Signed)
Patient returns for follow-up to chronic great toe pain in the setting of concern for extensor houses longus tendinopathy.  At last visit he was placed on meloxicam and noted essential symptom resolution however, noted recurrence following playing golf several days prior.  We discussed various evaluation and management strategies including advanced imaging, formal physical therapy, and medication management.  He has opted to start formal physical therapy where they will work on intrinsic foot musculature, gait mechanics, and transition from meloxicam to as needed diclofenac.  He is to provide a status update at the end of physical therapy, if still symptomatic, anticipate advanced imaging. ? ?Chronic condition, symptomatic, Rx management ?

## 2022-03-20 NOTE — Assessment & Plan Note (Signed)
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  He states that symptoms have persisted without significant improvement, has intermittent symptomatology.  Examination reveals now minimally tender MCP and PIP, resisted extension demonstrates 5 -/5 strength, however painless.  Given his mixed objective findings and stated symptomatology, did offer advanced imaging, immobilization, Rx management, and physical therapy.  Patient is opted for occupational hand therapy, transition to as needed diclofenac, and status update after PT/OT completes.  If suboptimal progress, anticipate advanced imaging. ?

## 2022-03-20 NOTE — Progress Notes (Signed)
?  ? ?  Primary Care / Sports Medicine Office Visit ? ?Patient Information:  ?Patient ID: Alexander Ball, male DOB: Jan 14, 1986 Age: 36 y.o. MRN: [id number]  ? ?Alexander Ball is a pleasant 36 y.o. male presenting with the following: ? ?Chief Complaint  ?Patient presents with  ? Follow-up  ?  Left foot was feeling better until he played golf last week ?Right finger/hand is about the same.   ? ? ?Vitals:  ? 03/20/22 1335  ?BP: 120/80  ?Pulse: 75  ?SpO2: 99%  ? ?Vitals:  ? 03/20/22 1335  ?Weight: 167 lb (75.8 kg)  ?Height: 5\' 10"  (1.778 m)  ? ?Body mass index is 23.96 kg/m?. ? ?No results found.  ? ?Independent interpretation of notes and tests performed by another provider:  ? ?None ? ?Procedures performed:  ? ?None ? ?Pertinent History, Exam, Impression, and Recommendations:  ? ?Problem List Items Addressed This Visit   ? ?  ? Musculoskeletal and Integument  ? Other injury of extensor muscle, fascia and tendon of right index finger at wrist and hand level, initial encounter  ?  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  He states that symptoms have persisted without significant improvement, has intermittent symptomatology.  Examination reveals now minimally tender MCP and PIP, resisted extension demonstrates 5 -/5 strength, however painless.  Given his mixed objective findings and stated symptomatology, did offer advanced imaging, immobilization, Rx management, and physical therapy.  Patient is opted for occupational hand therapy, transition to as needed diclofenac, and status update after PT/OT completes.  If suboptimal progress, anticipate advanced imaging. ? ?  ?  ? Relevant Medications  ? diclofenac (VOLTAREN) 75 MG EC tablet  ? Other Relevant Orders  ? Ambulatory referral to Occupational Therapy  ? Other injury of muscle and tendon of long flexor muscle of toe  at ankle and foot level, left foot, initial encounter  ?  Patient returns for follow-up to chronic great toe pain in the setting of concern for extensor houses longus tendinopathy.  At last visit he was placed on meloxicam and noted essential symptom resolution however, noted recurrence following playing golf several days prior.  We discussed various evaluation and management strategies including advanced imaging, formal physical therapy, and medication management.  He has opted to start formal physical therapy where they will work on intrinsic foot musculature, gait mechanics, and transition from meloxicam to as needed diclofenac.  He is to provide a status update at the end of physical therapy, if still symptomatic, anticipate advanced imaging. ? ?Chronic condition, symptomatic, Rx management ? ?  ?  ? Relevant Medications  ? diclofenac (VOLTAREN) 75 MG EC tablet  ? Other Relevant Orders  ? Ambulatory referral to Physical Therapy  ?  ? ?Orders & Medications ?Meds ordered this encounter  ?Medications  ? diclofenac (VOLTAREN) 75 MG EC tablet  ?  Sig: Take 1 tablet (75 mg total) by mouth 2 (two) times daily as needed.  ?  Dispense:  60 tablet  ?  Refill:  0  ? ?Orders Placed This Encounter  ?Procedures  ? Ambulatory referral to Occupational Therapy  ? Ambulatory referral to Physical Therapy  ?  ? ?No follow-ups on file.  ?  ? ?02/19/2022, MD ? ? Primary Care Sports Medicine ?[facility] ?[facility] [place]  ? ?

## 2022-04-20 ENCOUNTER — Other Ambulatory Visit: Payer: Self-pay | Admitting: Family Medicine

## 2022-04-20 DIAGNOSIS — S96092A Other injury of muscle and tendon of long flexor muscle of toe at ankle and foot level, left foot, initial encounter: Secondary | ICD-10-CM

## 2022-04-20 DIAGNOSIS — S66390A Other injury of extensor muscle, fascia and tendon of right index finger at wrist and hand level, initial encounter: Secondary | ICD-10-CM

## 2022-04-22 ENCOUNTER — Other Ambulatory Visit: Payer: Self-pay

## 2022-04-22 DIAGNOSIS — Z0000 Encounter for general adult medical examination without abnormal findings: Secondary | ICD-10-CM

## 2022-04-22 NOTE — Telephone Encounter (Signed)
Requested medication (s) are due for refill today - yes  Requested medication (s) are on the active medication list -yes  Future visit scheduled -no  Last refill: 03/20/22 #60  Notes to clinic: Patient was seen by specialty provider- referred for PT/OT and to follow up after- Refill request sent for review   Requested Prescriptions  Pending Prescriptions Disp Refills   diclofenac (VOLTAREN) 75 MG EC tablet [Pharmacy Med Name: DICLOFENAC SODIUM 75MG DR TABLETS] 60 tablet 0    Sig: TAKE 1 TABLET(75 MG) BY MOUTH TWICE DAILY AS NEEDED     Analgesics:  NSAIDS Failed - 04/20/2022  3:29 AM      Failed - Manual Review: Labs are only required if the patient has taken medication for more than 8 weeks.      Passed - Cr in normal range and within 360 days    Creat  Date Value Ref Range Status  04/26/2021 1.03 0.60 - 1.35 mg/dL Final         Passed - HGB in normal range and within 360 days    Hemoglobin  Date Value Ref Range Status  04/26/2021 13.6 13.2 - 17.1 g/dL Final  06/27/2015 13.6 12.6 - 17.7 g/dL Final         Passed - PLT in normal range and within 360 days    Platelets  Date Value Ref Range Status  04/26/2021 300 140 - 400 Thousand/uL Final  06/27/2015 342 150 - 379 x10E3/uL Final         Passed - HCT in normal range and within 360 days    HCT  Date Value Ref Range Status  04/26/2021 41.9 38.5 - 50.0 % Final   Hematocrit  Date Value Ref Range Status  06/27/2015 39.8 37.5 - 51.0 % Final         Passed - eGFR is 30 or above and within 360 days    GFR, Est African American  Date Value Ref Range Status  04/26/2021 111 > OR = 60 mL/min/1.30m Final   GFR, Est Non African American  Date Value Ref Range Status  04/26/2021 96 > OR = 60 mL/min/1.736mFinal         Passed - Patient is not pregnant      Passed - Valid encounter within last 12 months    Recent Outpatient Visits           1 month ago Other injury of extensor muscle, fascia and tendon of right index  finger at wrist and hand level, initial encounter   [facility]   2 months ago Other injury of extensor muscle, fascia and tendon of right index finger at wrist and hand level, initial encounter   [facility]   2 months ago Chronic foot pain, left   [place]   12 months ago Annual physical exam   [facility]   2 years ago Annual physical exam   [place]       Future Appointments             In 6 days [place] [facility]onne DoughtyDO [facility]              Requested Prescriptions  Pending Prescriptions Disp Refills   diclofenac (VOLTAREN) 75 MG EC tablet [Pharmacy Med Name: DICLOFENAC SODIUM  75MG DR TABLETS] 60 tablet 0    Sig: TAKE 1 TABLET(75 MG) BY MOUTH TWICE DAILY AS NEEDED     Analgesics:  NSAIDS Failed - 04/20/2022  3:29 AM      Failed - Manual Review: Labs are only required if the patient has taken medication for more than 8 weeks.      Passed - Cr in normal range and within 360 days    Creat  Date Value Ref Range Status  04/26/2021 1.03 0.60 - 1.35 mg/dL Final         Passed - HGB in normal range and within 360 days    Hemoglobin  Date Value Ref Range Status  04/26/2021 13.6 13.2 - 17.1 g/dL Final  06/27/2015 13.6 12.6 - 17.7 g/dL Final         Passed - PLT in normal range and within 360 days    Platelets  Date Value Ref Range Status  04/26/2021 300 140 - 400 Thousand/uL Final  06/27/2015 342 150 - 379 x10E3/uL Final         Passed - HCT in normal range and within 360 days    HCT  Date Value Ref Range Status  04/26/2021 41.9 38.5 - 50.0 % Final   Hematocrit  Date Value Ref Range Status  06/27/2015 39.8 37.5 - 51.0 % Final         Passed - eGFR is 30 or above and within 360 days    GFR, Est African American  Date Value  Ref Range Status  04/26/2021 111 > OR = 60 mL/min/1.60m Final   GFR, Est Non African American  Date Value Ref Range Status  04/26/2021 96 > OR = 60 mL/min/1.781mFinal         Passed - Patient is not pregnant      Passed - Valid encounter within last 12 months    Recent Outpatient Visits           1 month ago Other injury of extensor muscle, fascia and tendon of right index finger at wrist and hand level, initial encounter   [facility]   2 months ago Other injury of extensor muscle, fascia and tendon of right index finger at wrist and hand level, initial encounter   [facility]   2 months ago Chronic foot pain, left   [facility]   12 months ago Annual physical exam   [facility]   2 years ago Annual physical exam   [facility] [place]       Future Appointments             In 6 days [facility]Dev[facility]

## 2022-04-23 ENCOUNTER — Other Ambulatory Visit: Payer: 59

## 2022-04-24 ENCOUNTER — Other Ambulatory Visit: Payer: 59

## 2022-04-24 ENCOUNTER — Encounter: Payer: Self-pay | Admitting: Occupational Therapy

## 2022-04-24 ENCOUNTER — Ambulatory Visit: Payer: 59 | Attending: Family Medicine | Admitting: Occupational Therapy

## 2022-04-24 ENCOUNTER — Other Ambulatory Visit: Payer: Self-pay | Admitting: Family Medicine

## 2022-04-24 DIAGNOSIS — S96092A Other injury of muscle and tendon of long flexor muscle of toe at ankle and foot level, left foot, initial encounter: Secondary | ICD-10-CM | POA: Diagnosis not present

## 2022-04-24 DIAGNOSIS — Z0000 Encounter for general adult medical examination without abnormal findings: Secondary | ICD-10-CM

## 2022-04-24 DIAGNOSIS — M25641 Stiffness of right hand, not elsewhere classified: Secondary | ICD-10-CM | POA: Insufficient documentation

## 2022-04-24 DIAGNOSIS — M79641 Pain in right hand: Secondary | ICD-10-CM | POA: Diagnosis present

## 2022-04-24 DIAGNOSIS — K219 Gastro-esophageal reflux disease without esophagitis: Secondary | ICD-10-CM

## 2022-04-24 NOTE — Telephone Encounter (Signed)
Requested Prescriptions  Pending Prescriptions Disp Refills  . pantoprazole (PROTONIX) 20 MG tablet [Pharmacy Med Name: Pantoprazole Sodium 20 MG Oral Tablet Delayed Release] 90 tablet 0    Sig: TAKE 1 TABLET BY MOUTH  DAILY BEFORE BREAKFAST     Gastroenterology: Proton Pump Inhibitors Passed - 04/24/2022  4:22 AM      Passed - Valid encounter within last 12 months    Recent Outpatient Visits          1 month ago Other injury of extensor muscle, fascia and tendon of right index finger at wrist and hand level, initial encounter   [facility] Jerrol Banana, MD   2 months ago Other injury of extensor muscle, fascia and tendon of right index finger at wrist and hand level, initial encounter   [facility] MD   2 months ago Chronic foot pain, left   [facility] [place], Netta Neat, DO   12 months ago Annual physical exam   [facility] Smitty Cords, DO   2 years ago Annual physical exam   [facility] Althea Charon, Netta Neat, DO      Future Appointments            In 4 days Althea Charon, Netta Neat, DO [facility], [facility]

## 2022-04-24 NOTE — Therapy (Signed)
[facility] [address] [place], [address] Phone: [phone]   Fax:  [phone]  Occupational Therapy Evaluation  Patient Details  Name: Alexander Ball MRN: [id number] Date of Birth: [birth year] Referring Provider (OT): DR Albesa Seen Date: 04/24/2022   OT End of Session - 04/24/22 1941     Visit Number 1    Number of Visits 6    Date for OT Re-Evaluation 06/05/22    OT Start Time 1405    OT Stop Time 1446    OT Time Calculation (min) 41 min    Activity Tolerance Patient tolerated treatment well    Behavior During Therapy [facility] for tasks assessed/performed             Past Medical History:  Diagnosis Date   Allergy    GERD (gastroesophageal reflux disease)     History reviewed. No pertinent surgical history.  There were no vitals filed for this visit.   Subjective Assessment - 04/24/22 1934     Pertinent History 03/20/22 seen by Dr Ashley Royalty - Right-hand-dominant patient presents for follow-up to longstanding right second digit pain, at last visit on 02/17/2022 concern for extensor tendinopathy of the right second digit noted.  He states that symptoms have persisted without significant improvement, has intermittent symptomatology.  Examination reveals now minimally tender MCP and PIP, resisted extension demonstrates 5 -/5 strength, however painless.  Given his mixed objective findings and stated symptomatology, did offer advanced imaging, immobilization, Rx management, and physical therapy.  Patient is opted for occupational hand therapy, transition to as needed diclofenac, and status update after PT/OT completes.  If suboptimal progress, anticipate advanced imaging    Patient Stated Goals Want to be pain free to do my weights in gym, biking, gripping    Currently in Pain? 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, work out in gym, Presenter, broadcasting,      Chiropractor Grip (lbs) 104    Right Hand Lateral Pinch 22 lbs   discomfort PIP 2nd   Right Hand 3 Point Pinch 19 lbs   15 pain letting go 2Point   Left Hand Grip (lbs) 97    Left Hand Lateral Pinch 21 lbs    Left Hand 3 Point Pinch 17 lbs   16lbs  2point     Right Hand AROM   R Index  MCP 0-90 90 Degrees    R Index PIP 0-100 100 Degrees    R Index DIP 0-70 70 Degrees                      OT Treatments/Exercises (OP) - 04/24/22 0001  RUE Contrast Bath   Time 8 minutes    Comments AROM tendon glides - review of HEP               Patient to do in the morning contrast for stiffness in the evenings to calm things down decrease inflammation and pain.  Followed by gentle active range of motion pain-free tendon glides.  10 reps In the shower can do tendon glides with a composite fist and wrist flexion gentle active stretch 5 reps 5 seconds hold pain-free. Patient to pay attention of not gripping with the lateral pinch or a key grip but use palm with holding phone or documentation and papers.  Patient to hold off in the gym of loading heavy weights in the next week or 2 while doing home program follow-up with OT. Education was done with patient about no tenderness at this time over extensor tendon or radial sensory branch of nerve.  But because of second digit involving and prehension pinch and not composite.  Patient not forced to use second digit in  composite flexion.  But when doing loaded or composite gripping activities requiring composite flexion of second digit-patient experienced pain.  Patient to do contrast followed by tendon glides in the morning in the evening to increase pain-free composite flexion.  No tenderness over A1 pulley.       OT Education - 04/24/22 1941     Education Details findings of eval and HEP    Person(s) Educated Patient    Methods Explanation;Demonstration;Tactile cues;Verbal cues;Handout    Comprehension Verbal cues required;Returned demonstration;Verbalized understanding                 OT Long Term Goals - 04/24/22 1952       OT LONG TERM GOAL #1   Title Patient to be independent in home program to decrease pain to less than 2/10 with composite flexion of second digit during the day with work activities and gym    Baseline Pain during composite flexion of second digit with release of tendon can increase to 5/10 pain.    Time 4    Period Weeks    Status New    Target Date 05/22/22      OT LONG TERM GOAL #2   Title Right hand 2 and three-point pinch increased to within normal limits compared to the left hand without increase symptoms    Baseline Right hand three-point pinch 19 pounds left 17 pounds pain with release on right, two-point pinch 15 pounds on the right, 16 pounds in the left, with increased pain with released of right second digit    Time 6    Period Weeks    Status New    Target Date 06/05/22      OT LONG TERM GOAL #3   Title Patient returned to working out in [employer] gym, biking for recreational as well as shooting his gun without increase symptoms    Baseline Patient pain in the right second digit increased to 5/10 with any of above activities    Time 6    Period Weeks    Status New    Target Date 06/05/22                   Plan - 04/24/22 1942     Clinical Impression Statement Patient is a referred to OT for right dominant hand second digit extensor  tendinopathy.  Patient was first seen a doctor's office on 02/17/2022 concern for extensor  tendinopathy of the right second digit noted.  On 03/20/2022 follow-up appointment he continued to have persistent symptoms of pain in right dominant hand second digit at MCP and PIP.  Patient referred at that time to OT services.  Patient report he tried Voltaren as well as oral medication prior to this but persistent pain continued.  Patient reports that pain at the most and second digit over dorsal metacarpal into the second digit is at the most 5/10 with a burning pain at times.  He does not notice edema.  Patient do have increased pain after composite flexion of second digit as well as assessing two-point and three-point pinch.  Patient at rest 1/10 but can increase with composite flexion and assessing pinch grip to 4-5/10.  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  Patient also with increased discomfort during working out in the gym, gripping handlebars during biking and would have to shoot his gun for long..  Patient negative for any Tinel or tenderness over extensor tendon, resistance with extensor tendon or second digit extension.  Discomfort mostly over dorsal second digit during composite fisting as well as 2 and three-point pinch.  Patient favoring for about 2 months now composite fist during gripping activities.  Patient educated on using contrast in the morning in the evening prior to tendon glides pain-free as well as composite flexion with wrist flexion in the shower.  Patient to continue with home program for about a week to 10 days and follow-up with me.  Patient can benefit from OT services to increase active range of motion in right dominant second digit as well as decreasing pain to increase independence in use of right hand and IADLs and ADLs.    OT Occupational Profile and History Problem Focused Assessment - Including review of records relating to  presenting problem    Occupational performance deficits (Please refer to evaluation for details): IADL's;Work;Play;Leisure;Social Participation    Body Structure / Function / Physical Skills Strength;ADL;Pain;UE functional use;IADL;ROM;Flexibility    Rehab Potential Good    Clinical Decision Making Limited treatment options, no task modification necessary    Comorbidities Affecting Occupational Performance: None    Modification or Assistance to Complete Evaluation  No modification of tasks or assist necessary to complete eval    OT Frequency 1x / week    OT Duration 6 weeks    OT Treatment/Interventions Self-care/ADL training;Ultrasound;Fluidtherapy;Contrast Bath;Therapeutic exercise;Manual Therapy;Splinting;Iontophoresis;Patient/family education    Consulted and Agree with Plan of Care Patient             Patient will benefit from skilled therapeutic intervention in order to improve the following deficits and impairments:   Body Structure / Function / Physical Skills: Strength, ADL, Pain, UE functional use, IADL, ROM, Flexibility       Visit Diagnosis: Pain in right hand - Plan: Ot plan of care cert/re-cert, Ot plan of care cert/re-cert  Stiffness of right hand, not elsewhere classified - Plan: Ot plan of care cert/re-cert, Ot plan of care cert/re-cert    Problem List Patient Active Problem List   Diagnosis Date Noted   Other injury of extensor muscle, fascia and tendon of right index finger at wrist and hand level, initial encounter 02/17/2022   Other injury of muscle and tendon of long flexor muscle of toe at ankle and foot level, left foot, initial encounter 02/17/2022   Genital herpes simplex type 1 infection 04/01/2018   Normocytic anemia 08/19/2017  Environmental and seasonal allergies 08/19/2017   Tonsillith 07/21/2017   GERD without esophagitis 11/12/2015   History of smoking 11/12/2015    Oletta Cohn, OTR/L,CLT 04/24/2022, 7:57 PM  [facility]  [facility] [address] [place], [address] Phone: [phone]   Fax:  [phone]  Name: Alexander Ball MRN: [id number] Date of Birth: Oct 01, 1986

## 2022-04-25 LAB — LIPID PANEL
Cholesterol: 124 mg/dL (ref <200)
HDL: 37 mg/dL — ABNORMAL LOW (ref >=40)
LDL Cholesterol (Calc): 76 mg/dL (calc)
Non-HDL Cholesterol (Calc): 87 mg/dL (calc) (ref <130)
Total CHOL/HDL Ratio: 3.4 (calc) (ref <5.0)
Triglycerides: 37 mg/dL (ref <150)

## 2022-04-25 LAB — CBC WITH DIFFERENTIAL/PLATELET
Absolute Monocytes: 706 cells/uL (ref 200–950)
Basophils Absolute: 79 cells/uL (ref 0–200)
Basophils Relative: 1.2 %
Eosinophils Absolute: 224 cells/uL (ref 15–500)
Eosinophils Relative: 3.4 %
HCT: 34.9 % — ABNORMAL LOW (ref 38.5–50.0)
Hemoglobin: 11.7 g/dL — ABNORMAL LOW (ref 13.2–17.1)
Lymphs Abs: 2145 cells/uL (ref 850–3900)
MCH: 29.4 pg (ref 27.0–33.0)
MCHC: 33.5 g/dL (ref 32.0–36.0)
MCV: 87.7 fL (ref 80.0–100.0)
MPV: 10.3 fL (ref 7.5–12.5)
Monocytes Relative: 10.7 %
Neutro Abs: 3445 cells/uL (ref 1500–7800)
Neutrophils Relative %: 52.2 %
Platelets: 281 10*3/uL (ref 140–400)
RBC: 3.98 10*6/uL — ABNORMAL LOW (ref 4.20–5.80)
RDW: 12.2 % (ref 11.0–15.0)
Total Lymphocyte: 32.5 %
WBC: 6.6 10*3/uL (ref 3.8–10.8)

## 2022-04-25 LAB — COMPREHENSIVE METABOLIC PANEL
AG Ratio: 1.5 (calc) (ref 1.0–2.5)
ALT: 20 U/L (ref 9–46)
AST: 17 U/L (ref 10–40)
Albumin: 4.1 g/dL (ref 3.6–5.1)
Alkaline phosphatase (APISO): 59 U/L (ref 36–130)
BUN: 13 mg/dL (ref 7–25)
CO2: 25 mmol/L (ref 20–32)
Calcium: 9.2 mg/dL (ref 8.6–10.3)
Chloride: 105 mmol/L (ref 98–110)
Creat: 0.94 mg/dL (ref 0.60–1.26)
Globulin: 2.8 g/dL (calc) (ref 1.9–3.7)
Glucose, Bld: 92 mg/dL (ref 65–99)
Potassium: 4.5 mmol/L (ref 3.5–5.3)
Sodium: 139 mmol/L (ref 135–146)
Total Bilirubin: 0.4 mg/dL (ref 0.2–1.2)
Total Protein: 6.9 g/dL (ref 6.1–8.1)

## 2022-04-25 LAB — HEMOGLOBIN A1C
Hgb A1c MFr Bld: 5.4 % of total Hgb (ref <5.7)
Mean Plasma Glucose: 108 mg/dL
eAG (mmol/L): 6 mmol/L

## 2022-04-28 ENCOUNTER — Encounter: Payer: 59 | Admitting: Family Medicine

## 2022-04-29 ENCOUNTER — Other Ambulatory Visit: Payer: Self-pay | Admitting: Family Medicine

## 2022-04-29 ENCOUNTER — Ambulatory Visit (INDEPENDENT_AMBULATORY_CARE_PROVIDER_SITE_OTHER): Payer: 59 | Admitting: Family Medicine

## 2022-04-29 ENCOUNTER — Encounter: Payer: Self-pay | Admitting: Family Medicine

## 2022-04-29 VITALS — BP 120/62 | HR 69 | Ht 70.0 in | Wt 166.0 lb

## 2022-04-29 DIAGNOSIS — Z0000 Encounter for general adult medical examination without abnormal findings: Secondary | ICD-10-CM | POA: Diagnosis not present

## 2022-04-29 DIAGNOSIS — K219 Gastro-esophageal reflux disease without esophagitis: Secondary | ICD-10-CM | POA: Diagnosis not present

## 2022-04-29 DIAGNOSIS — J3089 Other allergic rhinitis: Secondary | ICD-10-CM

## 2022-04-29 DIAGNOSIS — D649 Anemia, unspecified: Secondary | ICD-10-CM

## 2022-04-29 DIAGNOSIS — Z13220 Encounter for screening for lipoid disorders: Secondary | ICD-10-CM

## 2022-04-29 DIAGNOSIS — Z131 Encounter for screening for diabetes mellitus: Secondary | ICD-10-CM

## 2022-04-29 NOTE — Patient Instructions (Addendum)
Thank you for coming to the office today.  Keep up the great work!  Labs look great  Hemoglobin is mildly low due to donating blood right before.  Keep up with physical therapy OT on the hand wrist   DUE for FASTING BLOOD WORK (no food or drink after midnight before the lab appointment, only water or coffee without cream/sugar on the morning of)  SCHEDULE "Lab Only" visit in the morning at the clinic for lab draw in 1 YEAR  - Make sure Lab Only appointment is at about 1 week before your next appointment, so that results will be available  For Lab Results, once available within 2-3 days of blood draw, you can can log in to MyChart online to view your results and a brief explanation. Also, we can discuss results at next follow-up visit.   Please schedule a Follow-up Appointment to: Return in about 1 year (around 04/30/2023) for 1 year fasting lab only then 1 week later Annual Physical.  If you have any other questions or concerns, please feel free to call the office or send a message through [facility]. You may also schedule an earlier appointment if necessary.  Additionally, you may be receiving a survey about your experience at our office within a few days to 1 week by e-mail or mail. We value your feedback.  Nobie Putnam, DO [facility]

## 2022-04-29 NOTE — Progress Notes (Signed)
Subjective:    Patient ID: Alexander Ball, male    DOB: 1986/05/16, 36 y.o.   MRN: [id number]  Alexander Ball is a 36 y.o. male presenting on 04/29/2022 for Annual Exam   HPI  Here for Annual Physical, due for fasting lab tomorrow.   GERD LFTs normalized on last lab. He was not consuming alcohol for 6-8 months. He tried drinking smaller amounts for period of time. Now he has completely quit drinking again abstained recently again. He follows a therapist Vikki Ports located in [place] usually does virtual visit, was going every other week, and now every 3 weeks. Intermittent PPI Pantoprazole 20mg  about 6 days a month, has plenty of med Denies any abdominal pain, nausea vomiting dark stools   Seasonal / Environmental Allergies: Reports chronic problem usually Spring and late Summer On OTC anti histamine. Taking Flonase regularly   Lifestyle Currently working on exercise regimen Weight stable Some protein shake supplement Staying active Weight training   Additional concern   Right Knee Chronic Pain He has followed with [facility]. Had MRI, negative without tear or injury, had some fluid, has improved. But still has some problems.  R Hand wrist. Working with physical therapy/OT now with improvement.   Health Maintenance: UTD [place]. He has not received COVID booster yet, he had covid 11/2020, he is considering the booster in the near future.   Age 8, not indicated for early colon or prostate CA Screening. No known early family history of these cancers that would suggest earlier testing.     04/29/2022    8:51 AM 03/20/2022    1:38 PM 02/17/2022    9:29 AM  Depression screen PHQ 2/9  Decreased Interest 0  0  Down, Depressed, Hopeless 0 0 0  PHQ - 2 Score 0 0 0  Altered sleeping 0 0 0  Tired, decreased energy 0 0 0  Change in appetite 0 0 0  Feeling bad or failure about yourself  0 0 0  Trouble concentrating 0  0  Moving slowly  or fidgety/restless 0 0 0  Suicidal thoughts 0 0 0  PHQ-9 Score 0 0 0  Difficult doing work/chores Not difficult at all Not difficult at all Not difficult at all    Past Medical History:  Diagnosis Date   Allergy    GERD (gastroesophageal reflux disease)    History reviewed. No pertinent surgical history. 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[employer])  Tobacco Use   Smoking status: Former    Types: Cigarettes    Quit date: 06/25/2014    Years since quitting: 7.8   Smokeless tobacco: Former  Scientific laboratory technician Use: Never used  Substance and Sexual Activity   Alcohol use: Yes    Comment: 2-4 times a month    Drug use: No   Sexual activity: Yes    Birth control/protection: Inserts, Injection  Other Topics Concern   Not on file  Social History Narrative   Not on file   Social Determinants of Health   Financial Resource Strain: Not on file  Food Insecurity: Not on file  Transportation Needs: Not on file  Physical Activity: Not on file  Stress: Not on file  Social Connections: Not on file  Intimate Partner Violence: Not on file   Family History  Problem Relation Age of Onset   Heart disease Maternal Uncle    Hypertension Maternal Uncle    Diabetes Maternal Aunt    Prostate cancer Neg Hx    Colon cancer Neg Hx    Current Outpatient Medications on File Prior to Visit  Medication Sig   fluticasone (FLONASE) 50 MCG/ACT nasal spray Place 2 sprays into both nostrils daily.   pantoprazole (PROTONIX) 20 MG tablet TAKE 1 TABLET BY MOUTH  DAILY BEFORE BREAKFAST   diclofenac (VOLTAREN) 75 MG EC tablet TAKE 1 TABLET(75 MG) BY MOUTH TWICE DAILY AS NEEDED (Patient not taking: Reported on 04/29/2022)   No current facility-administered medications on file prior  to visit.    Review of Systems  Constitutional:  Negative for activity change, appetite change, chills, diaphoresis, fatigue and fever.  HENT:  Negative for congestion and hearing loss.   Eyes:  Negative for visual disturbance.  Respiratory:  Negative for cough, chest tightness, shortness of breath and wheezing.   Cardiovascular:  Negative for chest pain, palpitations and leg swelling.  Gastrointestinal:  Negative for abdominal pain, constipation, diarrhea, nausea and vomiting.  Genitourinary:  Negative for dysuria, frequency and hematuria.  Musculoskeletal:  Negative for arthralgias and neck pain.  Skin:  Negative for rash.  Neurological:  Negative for dizziness, weakness, light-headedness, numbness and headaches.  Hematological:  Negative for adenopathy.  Psychiatric/Behavioral:  Negative for behavioral problems, dysphoric mood and sleep disturbance.   Per HPI unless specifically indicated above      Objective:    BP 120/62   Pulse 69   Ht 5\' 10"  (1.778 m)   Wt 166 lb (75.3 kg)   SpO2 99%   BMI 23.82 kg/m   Wt Readings from Last 3 Encounters:  04/29/22 166 lb (75.3 kg)  03/20/22 167 lb (75.8 kg)  02/17/22 167 lb 3.2 oz (75.8 kg)    Physical Exam Vitals and nursing note reviewed.  Constitutional:      General: He is not in acute distress.    Appearance: He is well-developed. He is not diaphoretic.     Comments: Well-appearing, comfortable, cooperative  HENT:     Head: Normocephalic and atraumatic.  Eyes:     General:        Right eye: No discharge.        Left eye: No discharge.     Conjunctiva/sclera: Conjunctivae normal.     Pupils: Pupils are equal, round, and reactive to light.  Neck:     Thyroid: No thyromegaly.     Vascular: No carotid bruit.  Cardiovascular:     Rate and Rhythm: Normal rate and regular rhythm.     Pulses: Normal pulses.     Heart sounds: Normal heart sounds. No murmur heard. Pulmonary:     Effort: Pulmonary effort is normal. No  respiratory distress.     Breath sounds: Normal breath sounds. No wheezing or rales.  Abdominal:     General: Bowel sounds are normal. There is no distension.     Palpations: Abdomen is soft. There is no mass.     Tenderness: There is no abdominal tenderness.  Musculoskeletal:        General: No tenderness. Normal range of motion.     Cervical back: Normal range of motion and neck supple.     Right lower leg: No edema.     Left lower leg: No edema.  Comments: Upper / Lower Extremities: - Normal muscle tone, strength bilateral upper extremities 5/5, lower extremities 5/5  Lymphadenopathy:     Cervical: No cervical adenopathy.  Skin:    General: Skin is warm and dry.     Findings: No erythema or rash.  Neurological:     Mental Status: He is alert and oriented to person, place, and time.     Comments: Distal sensation intact to light touch all extremities  Psychiatric:        Mood and Affect: Mood normal.        Behavior: Behavior normal.        Thought Content: Thought content normal.     Comments: Well groomed, good eye contact, normal speech and thoughts      Results for orders placed or performed in visit on 04/24/22  Lipid panel  Result Value Ref Range   Cholesterol 124 <200 mg/dL   HDL 37 (L) > OR = 40 mg/dL   Triglycerides 37 <150 mg/dL   LDL Cholesterol (Calc) 76 mg/dL (calc)   Total CHOL/HDL Ratio 3.4 <5.0 (calc)   Non-HDL Cholesterol (Calc) 87 <130 mg/dL (calc)  Comprehensive Metabolic Panel (CMET)  Result Value Ref Range   Glucose, Bld 92 65 - 99 mg/dL   BUN 13 7 - 25 mg/dL   Creat 0.94 0.60 - 1.26 mg/dL   BUN/Creatinine Ratio NOT APPLICABLE 6 - 22 (calc)   Sodium 139 135 - 146 mmol/L   Potassium 4.5 3.5 - 5.3 mmol/L   Chloride 105 98 - 110 mmol/L   CO2 25 20 - 32 mmol/L   Calcium 9.2 8.6 - 10.3 mg/dL   Total Protein 6.9 6.1 - 8.1 g/dL   Albumin 4.1 3.6 - 5.1 g/dL   Globulin 2.8 1.9 - 3.7 g/dL (calc)   AG Ratio 1.5 1.0 - 2.5 (calc)   Total Bilirubin  0.4 0.2 - 1.2 mg/dL   Alkaline phosphatase (APISO) 59 36 - 130 U/L   AST 17 10 - 40 U/L   ALT 20 9 - 46 U/L  CBC with Differential  Result Value Ref Range   WBC 6.6 3.8 - 10.8 Thousand/uL   RBC 3.98 (L) 4.20 - 5.80 Million/uL   Hemoglobin 11.7 (L) 13.2 - 17.1 g/dL   HCT 34.9 (L) 38.5 - 50.0 %   MCV 87.7 80.0 - 100.0 fL   MCH 29.4 27.0 - 33.0 pg   MCHC 33.5 32.0 - 36.0 g/dL   RDW 12.2 11.0 - 15.0 %   Platelets 281 140 - 400 Thousand/uL   MPV 10.3 7.5 - 12.5 fL   Neutro Abs 3,445 1,500 - 7,800 cells/uL   Lymphs Abs 2,145 850 - 3,900 cells/uL   Absolute Monocytes 706 200 - 950 cells/uL   Eosinophils Absolute 224 15 - 500 cells/uL   Basophils Absolute 79 0 - 200 cells/uL   Neutrophils Relative % 52.2 %   Total Lymphocyte 32.5 %   Monocytes Relative 10.7 %   Eosinophils Relative 3.4 %   Basophils Relative 1.2 %  HgB A1c  Result Value Ref Range   Hgb A1c MFr Bld 5.4 <5.7 % of total Hgb   Mean Plasma Glucose 108 mg/dL   eAG (mmol/L) 6.0 mmol/L      Assessment & Plan:   Problem List Items Addressed This Visit     Normocytic anemia   GERD without esophagitis   Environmental and seasonal allergies   Other Visit Diagnoses     Annual physical exam    -  Primary       Updated Health Maintenance information Reviewed recent lab results with patient Hgb mildly low due to donating blood, otherwise reassurance. LFTs normalized. Abstaining from alcohol, encouraged to continue the good work. Encouraged improvement to lifestyle with diet and exercise Goal of weight loss   No orders of the defined types were placed in this encounter.   Follow up plan: Return in about 1 year (around 04/30/2023) for 1 year fasting lab only then 1 week later Annual Physical.  Future labs ordered 04/2022  Nobie Putnam, [facility] 04/29/2022, 8:12 AM

## 2022-05-06 ENCOUNTER — Encounter: Payer: 59 | Admitting: Occupational Therapy

## 2022-05-09 ENCOUNTER — Ambulatory Visit: Payer: 59 | Admitting: Occupational Therapy

## 2022-05-15 ENCOUNTER — Ambulatory Visit: Payer: 59 | Admitting: Occupational Therapy

## 2022-05-15 DIAGNOSIS — M79641 Pain in right hand: Secondary | ICD-10-CM | POA: Diagnosis not present

## 2022-05-15 DIAGNOSIS — M25641 Stiffness of right hand, not elsewhere classified: Secondary | ICD-10-CM

## 2022-05-15 NOTE — Therapy (Signed)
[facility] [address], [place], [address] Phone: [phone]   Fax:  [phone]  Occupational Therapy Treatment  Patient Details  Name: Alexander Ball MRN: [id number] Date of Birth: 02-05-1986 Referring Provider (OT): DR Albesa Seen Date: 05/15/2022   OT End of Session - 05/15/22 1108     Visit Number 2    Number of Visits 6    Date for OT Re-Evaluation 06/05/22    OT Start Time 1030    OT Stop Time 1105    OT Time Calculation (min) 35 min    Activity Tolerance Patient tolerated treatment well    Behavior During Therapy [facility] for tasks assessed/performed             Past Medical History:  Diagnosis Date   Allergy    GERD (gastroesophageal reflux disease)     No past surgical history on file.  There were no vitals filed for this visit.   Subjective Assessment - 05/15/22 1105     Subjective  Sorry I had to cancel I did not see you since 3 weeks ago.  Was doing good this this week.  I popped my wrist the week of the 12th and had increased pain over the ulnar side of the hand.  The hot and cold got better.  Then I did a lot of driving at work and did some pull-ups 100 in the gym.  Since then is little bit irritated like a 1/10 pain.    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  He states that symptoms have persisted without significant improvement, has intermittent symptomatology.  Examination reveals now minimally tender MCP and PIP, resisted extension demonstrates 5 -/5 strength, however painless.  Given his mixed objective findings and stated symptomatology, did offer advanced imaging, immobilization, Rx management, and physical therapy.  Patient is opted for occupational hand therapy, transition  to as needed diclofenac, and status update after PT/OT completes.  If suboptimal progress, anticipate advanced imaging    Patient Stated Goals Want to be pain free to do my weights in gym, biking, gripping    Currently in Pain? Yes    Pain Score 1     Pain Location Finger (Comment which one)    Pain Orientation Right    Pain Descriptors / Indicators Aching;Tender    Pain Type Acute pain    Pain Onset More than a month ago    Pain Frequency Intermittent                [facility] OT Assessment - 05/15/22 0001       Strength   Right Hand Lateral Pinch 22 lbs    Right Hand 3 Point Pinch 19 lbs   15 1/10 pain     Right Hand AROM   R Index  MCP 0-90 90 Degrees    R Index PIP 0-100 100 Degrees    R Index DIP 0-70 70 Degrees                 Patient report he was doing really well the first week to 10 days.  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  Did some contrast pain decreased but then appear patient went to the gym back and did about 100 pull-ups.  With increased irritation over the lumbrical at second metacarpal.  Patient with 1/10 pain at radial side of second metacarpal with composite fist, lumbrical fist and lateral-three-point pinch.       OT Treatments/Exercises (OP) - 05/15/22 0001       RUE Contrast Bath   Time 8 minutes    Comments decrease pain , in crease ROM              Reviewed with patient again importance of doing morning contrast for stiffness in the evenings to calm things down decrease inflammation and pain.  Followed by gentle active range of motion pain-free tendon glides.  10 reps In the shower can do tendon glides with a composite fist and wrist flexion gentle active stretch 5 reps 5 seconds hold pain-free. Patient to pay attention of not gripping with the lateral  pinch or a key grip but use palm with holding phone or documentation and papers.  Reinforced with patient to keep range of motion as well as functional use and ADLs and IADLs-work activities pain-free prior to attempt of returning back to the gym doing a resistance, heavy weights or pull-ups.   After being a week pain-free patient to return back to gym but increasing gradually his reps and sets with weights especially pull-ups.  .  Patient feels pain is getting better as well as functional use with less pain the last 3 weeks.  Patient to continue with home program for 3 weeks.  Did fit patient with a Isotoner glove that he can use with driving as well as sleeping if comfortable.       OT Education - 05/15/22 1108     Education Details progress and HEP    Person(s) Educated Patient    Methods Explanation;Demonstration;Tactile cues;Verbal cues;Handout    Comprehension Verbal cues required;Returned demonstration;Verbalized understanding                 OT Long Term Goals - 04/24/22 1952       OT LONG TERM GOAL #1   Title Patient to be independent in home program to decrease pain to less than 2/10 with composite flexion of second digit during the day with work activities and gym    Baseline Pain during composite flexion of second digit with release of tendon can increase to 5/10 pain.    Time 4    Period Weeks    Status New    Target Date 05/22/22      OT LONG TERM GOAL #2   Title Right hand 2 and three-point pinch increased to within normal limits compared to the left hand without increase symptoms    Baseline Right hand three-point pinch 19 pounds left 17 pounds pain with release on right, two-point pinch 15 pounds on the right, 16 pounds in the left, with increased pain with released of right second digit    Time 6    Period Weeks    Status New    Target Date 06/05/22      OT LONG TERM GOAL #3   Title Patient returned to working out in [employer], biking for recreational  as well as shooting his gun without increase symptoms    Baseline Patient pain in the right second digit increased to 5/10 with any of above  activities    Time 6    Period Weeks    Status New    Target Date 06/05/22                   Plan - 05/15/22 1109     Clinical Impression Statement Patient is a referred to OT for 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  On 03/20/2022 follow-up appointment he continued to have persistent symptoms of pain in right dominant hand second digit at MCP and PIP.  Patient referred at that time to OT services.  Patient report he tried Voltaren as well as oral medication prior to this but persistent pain continued.  Patient seen 3 weeks ago patient report decreased pain first week but then done some driving training in his new job.  Done home program pain decreased.  But then patient did 100 pull-ups in the gym and that increase the pain again.  This date patient Affleck 1/10 pain at radial second MC with composite fist as well as lumbrical fist.  As well as lateral pinch and three-point pinch.  Appear patient still has some irritation of lumbrical at second [facility].  Reinforced with patient to continue to work on decreasing pain for a week in ADLs and IADLs as well as work activities.  Avoiding tight prolonged grip.  Patient returned then to driving and gradually increasing his reps and sets on pull-up bar.  To hold off the first week or 2 on resistance and strengthening.  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   Patient negative for any Tinel or tenderness over extensor tendon, resistance with extensor tendon or second digit extension.  Patient favoring for about 2-3 months now composite fist during gripping activities.  Patient educated on using contrast in the morning in the  evening prior to tendon glides pain-free as well as composite flexion with wrist flexion in the shower.   Fitted with a Isotoner glove that he can use during driving, as well as working out in decreasing pain.  Can also work at nighttime if no issues.  Patient to continue with home program for 3 weeks and follow-up with me.  Patient can benefit from OT services to increase active range of motion in right dominant second digit as well as decreasing pain to increase independence in use of right hand and IADLs and ADLs.    OT Occupational Profile and History Problem Focused Assessment - Including review of records relating to presenting problem    Occupational performance deficits (Please refer to evaluation for details): IADL's;Work;Play;Leisure;Social Participation    Body Structure / Function / Physical Skills Strength;ADL;Pain;UE functional use;IADL;ROM;Flexibility    Rehab Potential Good    Clinical Decision Making Limited treatment options, no task modification necessary    Comorbidities Affecting Occupational Performance: None    Modification or Assistance to Complete Evaluation  No modification of tasks or assist necessary to complete eval    OT Frequency 1x / week    OT Duration 6 weeks    OT Treatment/Interventions Self-care/ADL training;Ultrasound;Fluidtherapy;Contrast Bath;Therapeutic exercise;Manual Therapy;Splinting;Iontophoresis;Patient/family education    Consulted and Agree with Plan of Care Patient             Patient will benefit from skilled therapeutic intervention in order to improve the following deficits and impairments:   Body Structure /  Function / Physical Skills: Strength, ADL, Pain, UE functional use, IADL, ROM, Flexibility       Visit Diagnosis: Pain in right hand  Stiffness of right hand, not elsewhere classified    Problem List Patient Active Problem List   Diagnosis Date Noted   Other injury of extensor muscle, fascia and tendon of right index finger  at wrist and hand level, initial encounter 02/17/2022   Other injury of muscle and tendon of long flexor muscle of toe at ankle and foot level, left foot, initial encounter 02/17/2022   Genital herpes simplex type 1 infection 04/01/2018   Normocytic anemia 08/19/2017   Environmental and seasonal allergies 08/19/2017   Tonsillith 07/21/2017   GERD without esophagitis 11/12/2015   History of smoking 11/12/2015    Oletta Cohn, OTR/L,CLT 05/15/2022, 3:05 PM  [facility] [address] [place], [address] Phone: [phone]   Fax:  [phone]  Name: Alexander Ball MRN: [id number] Date of Birth: 04/16/1986

## 2022-06-05 ENCOUNTER — Ambulatory Visit: Payer: 59 | Attending: Family Medicine | Admitting: Occupational Therapy

## 2022-06-05 DIAGNOSIS — M25641 Stiffness of right hand, not elsewhere classified: Secondary | ICD-10-CM | POA: Diagnosis present

## 2022-06-05 DIAGNOSIS — M79641 Pain in right hand: Secondary | ICD-10-CM | POA: Insufficient documentation

## 2022-06-05 NOTE — Therapy (Signed)
[facility] [address] [place], [address] Phone: [phone]   Fax:  [phone]  Occupational Therapy Treatment  Patient Details  Name: Alexander Ball MRN: [id number] Date of Birth: 02-03-1986 Referring Provider (OT): DR Albesa Seen Date: 06/05/2022   OT End of Session - 06/05/22 1306     Visit Number 3    Number of Visits 6    Date for OT Re-Evaluation 06/05/22    OT Start Time 1201    OT Stop Time 1243    OT Time Calculation (min) 42 min    Activity Tolerance Patient tolerated treatment well    Behavior During Therapy [facility] for tasks assessed/performed             Past Medical History:  Diagnosis Date   Allergy    GERD (gastroesophageal reflux disease)     No past surgical history on file.  There were no vitals filed for this visit.   Subjective Assessment - 06/05/22 1303     Subjective  Is better than when I started.  Of more better days than bad days.  It was doing good this this week.  But then use it probably more and I have reached in the backseat and pulled something in her drink.    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  He states that symptoms have persisted without significant improvement, has intermittent symptomatology.  Examination reveals now minimally tender MCP and PIP, resisted extension demonstrates 5 -/5 strength, however painless.  Given his mixed objective findings and stated symptomatology, did offer advanced imaging, immobilization, Rx management, and physical therapy.  Patient is opted for occupational hand therapy, transition to as needed diclofenac, and status update after PT/OT completes.  If suboptimal progress, anticipate advanced imaging    Patient Stated Goals  Want to be pain free to do my weights in gym, biking, gripping    Currently in Pain? Yes    Pain Score 1     Pain Location Hand                  Patient was again for first week pain-free but after second and third week patient had increased pain after reaching out in the back of car putting her heavy bag with a key grip.  This date patient more discomfort over extensor tendon on second digit then radial side of second metacarpal last time. Discomfort a composite second digit flexion and endrange extension 1/10. No pain with resistance.       MC flexion 90, PIP 100 and DIP 72nd digit Extension within normal limits.     OT Treatments/Exercises (OP) - 06/05/22 0001       Ultrasound   Ultrasound Location 4    Ultrasound Parameters 3.69mhz,20% 0.8 over 2nd ext tendon      RUE Contrast Bath   Time 8 minutes    Comments decrease pain and increase motion               Reviewed with patient again importance of doing morning contrast for stiffness in the evenings to calm things down decrease inflammation and pain.  Followed by gentle active range of motion pain-free tendon glides.  10 reps In the shower can do tendon glides with a composite fist and wrist  flexion gentle active stretch 5 reps 5 seconds hold pain-free.  Patient to pay attention of not gripping with the lateral pinch or a key grip but use palm with holding phone or documentation and papers.  Reinforced with patient to keep range of motion as well as functional use and ADLs and IADLs-work activities pain-free prior to attempt of returning back to the gym doing a resistance, heavy weights or pull-ups.    Fabricated a MC block splint for patient to use with composite tight grips to prevent irritation at composite flexion.  Recommended may be a buddy strap to use third digit to facilitate active assisted range for the second for extension flexion.  Was trying it. At the same time continue with this contrast but then  try the Voltaren ointment again twice a day..   Patient due report less bad days than when we started therapy.  Explained to patient that if he had 6 days of pain and is down to 4 days improvement.  2 should now for the next 2 to 3 weeks for 2 or 3 days. If patient patient continues to have pain in 2 to 3 weeks he can contact Dr. For possibly diagnostic ultrasound or MRI.        OT Education - 06/05/22 1306     Education Details progress and HEP    Person(s) Educated Patient    Methods Explanation;Demonstration;Tactile cues;Verbal cues;Handout    Comprehension Verbal cues required;Returned demonstration;Verbalized understanding                 OT Long Term Goals - 04/24/22 1952       OT LONG TERM GOAL #1   Title Patient to be independent in home program to decrease pain to less than 2/10 with composite flexion of second digit during the day with work activities and gym    Baseline Pain during composite flexion of second digit with release of tendon can increase to 5/10 pain.    Time 4    Period Weeks    Status New    Target Date 05/22/22      OT LONG TERM GOAL #2   Title Right hand 2 and three-point pinch increased to within normal limits compared to the left hand without increase symptoms    Baseline Right hand three-point pinch 19 pounds left 17 pounds pain with release on right, two-point pinch 15 pounds on the right, 16 pounds in the left, with increased pain with released of right second digit    Time 6    Period Weeks    Status New    Target Date 06/05/22      OT LONG TERM GOAL #3   Title Patient returned to working out in [employer], biking for recreational as well as shooting his gun without increase symptoms    Baseline Patient pain in the right second digit increased to 5/10 with any of above activities    Time 6    Period Weeks    Status New    Target Date 06/05/22                   Plan - 06/05/22 1306     Clinical Impression Statement Patient  is a referred to OT for 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  On 03/20/2022 follow-up appointment he continued to have persistent symptoms of pain  in right dominant hand second digit at MCP and PIP.  Patient referred at that time to OT services.  Patient report he tried Voltaren as well as oral medication prior to this but persistent pain continued.  Patient seen 2 visits in 6 weeks.  Patient reports this week no pain.  But then had a second or third week increased pain after doing something.  Like this past 3 weeks it was reaching out in the back and pulling something out with a key grip.  The previous this time it was doing 100 pull-ups in the gym.  It appears patient has more good days than bad days and improving but still about 4 bad days a week.  Last week it was more on radial side of second metacarpal.  This date is over extensor tendon on second digit.  No pain with resistance but with endrange flexion and endrange extension.  Due to [facility] block splint for patient to use during composite gripping as well as buddy strap recommended.  Patient to continue with contrast and try Voltaren 2 times a day for 2 weeks.  If patient continues to have pain in 2 weeks he can contact physician about a possibly diagnostic ultrasound or MRI.  Patient favoring for about 2-3 months prior to therapy composite fist during gripping activities.  Patient educated on using contrast in the morning in the evening prior to tendon glides pain-free as well as composite flexion with wrist flexion in the shower. Patient can benefit from OT services to increase active range of motion in right dominant second digit as well as decreasing pain to increase independence in use of right hand and IADLs and ADLs.    OT Occupational Profile and History Problem Focused Assessment - Including review of records  relating to presenting problem    Occupational performance deficits (Please refer to evaluation for details): IADL's;Work;Play;Leisure;Social Participation    Body Structure / Function / Physical Skills Strength;ADL;Pain;UE functional use;IADL;ROM;Flexibility    Rehab Potential Good    Clinical Decision Making Limited treatment options, no task modification necessary    Comorbidities Affecting Occupational Performance: None    Modification or Assistance to Complete Evaluation  No modification of tasks or assist necessary to complete eval    OT Frequency Biweekly    OT Duration 6 weeks    OT Treatment/Interventions Self-care/ADL training;Ultrasound;Fluidtherapy;Contrast Bath;Therapeutic exercise;Manual Therapy;Splinting;Iontophoresis;Patient/family education    Consulted and Agree with Plan of Care Patient             Patient will benefit from skilled therapeutic intervention in order to improve the following deficits and impairments:   Body Structure / Function / Physical Skills: Strength, ADL, Pain, UE functional use, IADL, ROM, Flexibility       Visit Diagnosis: Pain in right hand  Stiffness of right hand, not elsewhere classified    Problem List Patient Active Problem List   Diagnosis Date Noted   Other injury of extensor muscle, fascia and tendon of right index finger at wrist and hand level, initial encounter 02/17/2022   Other injury of muscle and tendon of long flexor muscle of toe at ankle and foot level, left foot, initial encounter 02/17/2022   Genital herpes simplex type 1 infection 04/01/2018   Normocytic anemia 08/19/2017   Environmental and seasonal allergies 08/19/2017   Tonsillith 07/21/2017   GERD without esophagitis 11/12/2015   History of smoking 11/12/2015    Oletta Cohn, OTR/L,CLT 06/05/2022, 1:12 PM  [facility]  MEDICINE [address], [place], [address] Phone: [phone]   Fax:   [phone]  Name: Shlok Raz MRN: [id number] Date of Birth: [birth year]

## 2022-11-17 IMAGING — DX DG FOOT COMPLETE 3+V*L*
3 series · 3 of 3 positions shown · non-contrast
Comparison: None.

CLINICAL DATA: Chronic medial dorsal foot pain for greater than 1
year. Evaluate for stress fracture. Left great toe and head of first
metatarsal pain off and on for 1 year.

EXAM:
LEFT FOOT - COMPLETE 3+ VIEW

[foot ap]
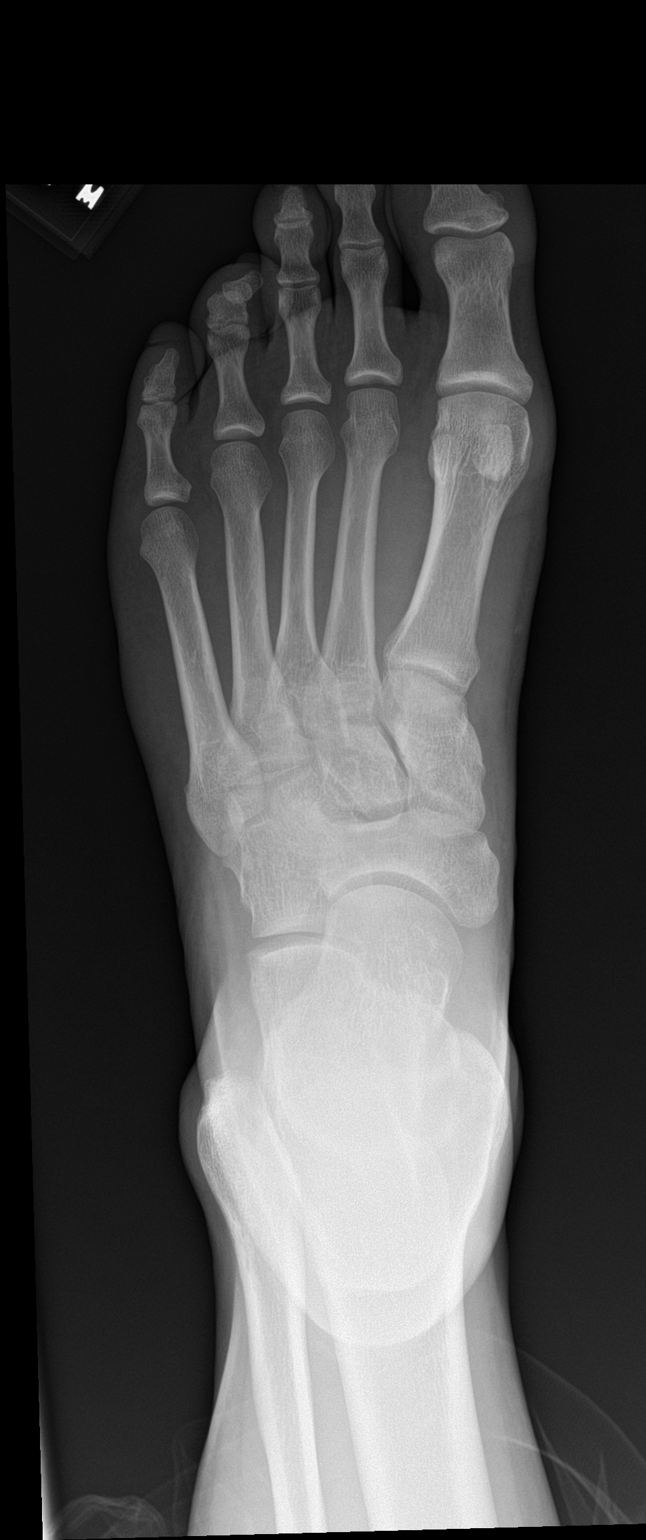

[foot obl]
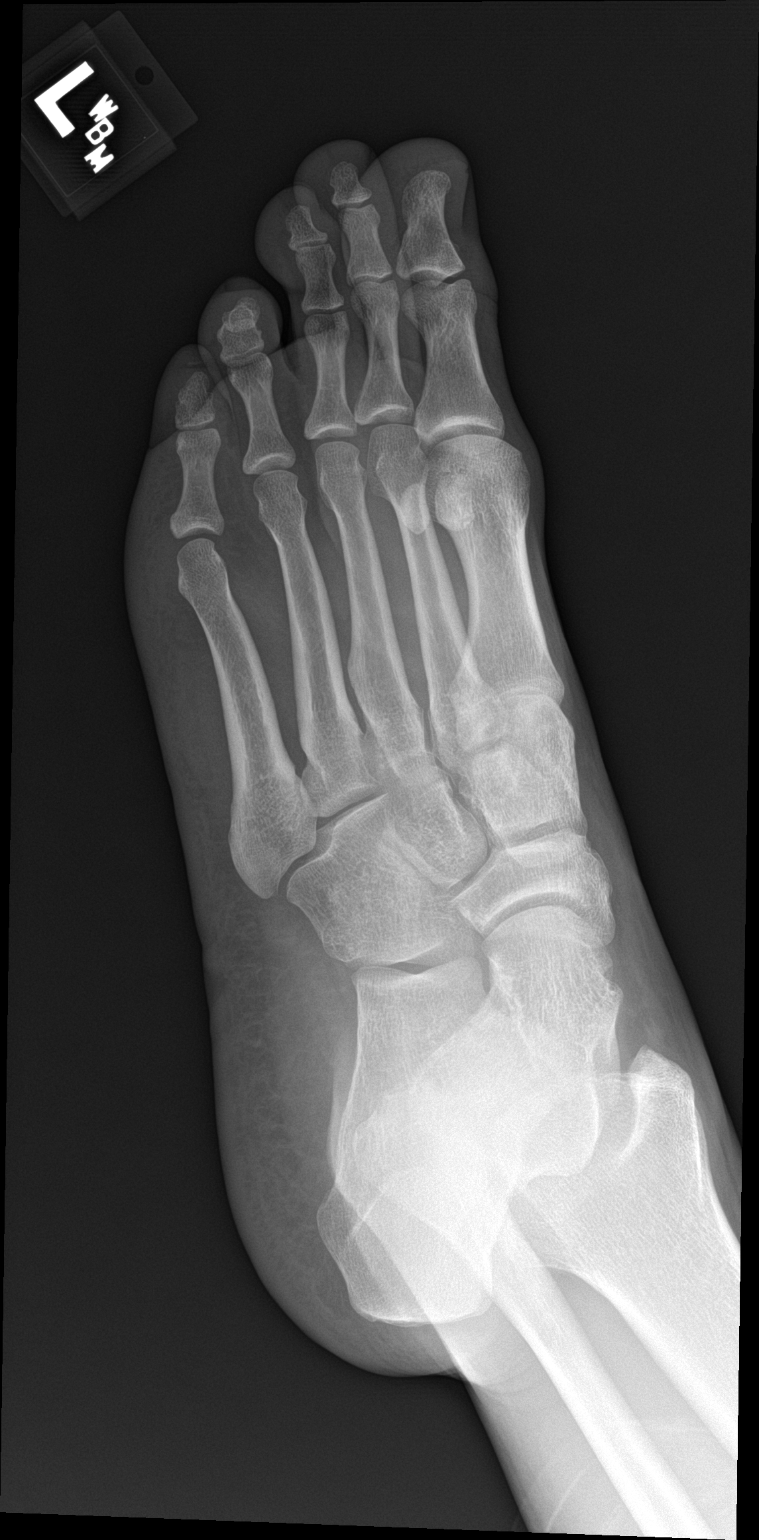

[foot lat]
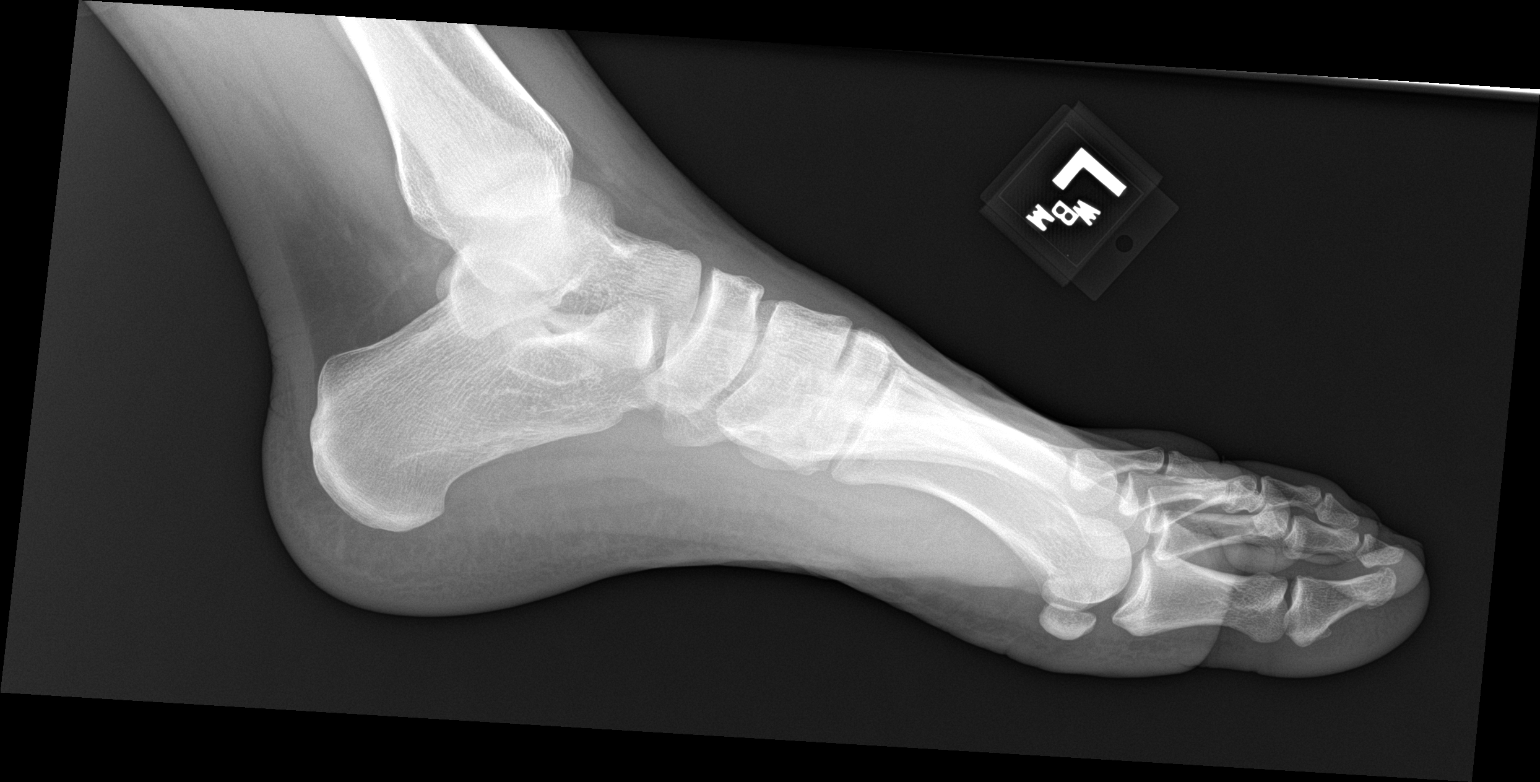

[3 of 3 positions shown; findings below may reference images not displayed]

FINDINGS: Normal bone mineralization. Joint spaces are preserved. No acute
fracture is seen. No dislocation.
IMPRESSION: No significant degenerative change.  No acute fracture.

## 2023-06-12 ENCOUNTER — Other Ambulatory Visit: Payer: Self-pay | Admitting: Family Medicine

## 2023-06-12 ENCOUNTER — Ambulatory Visit (INDEPENDENT_AMBULATORY_CARE_PROVIDER_SITE_OTHER): Payer: 59 | Admitting: Family Medicine

## 2023-06-12 ENCOUNTER — Encounter: Payer: Self-pay | Admitting: Family Medicine

## 2023-06-12 VITALS — BP 112/72 | HR 67 | Ht 70.0 in | Wt 152.0 lb

## 2023-06-12 DIAGNOSIS — S93501A Unspecified sprain of right great toe, initial encounter: Secondary | ICD-10-CM | POA: Insufficient documentation

## 2023-06-12 MED ORDER — DICLOFENAC SODIUM 75 MG PO TBEC: 75.0000 mg | DELAYED_RELEASE_TABLET | Freq: Two times a day (BID) | ORAL | 0 refills | Status: DC

## 2023-06-12 NOTE — Progress Notes (Signed)
     Primary Care / Sports Medicine Office Visit  Patient Information:  Patient ID: Alexander Ball, male DOB: 07-19-1986 Age: 37 y.o. MRN: [id number]   Alexander Ball is a pleasant 37 y.o. male presenting with the following:  Chief Complaint  Patient presents with   Toe Injury    Right great toe 3 weeks, pain with walking, injury unknown.     Vitals:   06/12/23 0915  BP: 112/72  Pulse: 67  SpO2: 97%   Vitals:   06/12/23 0915  Weight: 152 lb (68.9 kg)  Height: 5\' 10"  (1.778 m)   Body mass index is 21.81 kg/m.  No results found.   Independent interpretation of notes and tests performed by another provider:   None  Procedures performed:   None  Pertinent History, Exam, Impression, and Recommendations:   Alexander Ball was seen today for toe injury.  Sprain of right great toe, initial encounter Assessment & Plan: Acute, possibly traumatic onset of right great toe MTP pain in the setting of high baseline athletics (martial arts, gym, etc). Denies any ecchymosis, no significnat swelling. Has had pain with ambulation and primarily explosive motions but has otherwise been able to run/jog with slow ramp up.  Examination with minor 1st MTP swelling, no erythema or ecchymosis, has full, painful AROM, minimally painful PROM with flexion of the 1st MTP, tenderness at MTP. 5/5 strength noted.  Findings most consistent with sprain vs tendinopathy at the great toe. Plan as follows:  - Buddy-tape applied, continue if beneficial - Start diclofenac twice daily with food x 1 week then twice daily as-needed - If any stomach symptoms arise, restart pantoprazole - Start rehab exercises as below - If limited progress after 1-2 weeks, obtain "post-op shoe" and use daily x 2-3 weeks - Contact our office for any persistent symptoms at the 4 week mark or beyond  Orders: -     Diclofenac Sodium; Take 1 tablet (75 mg total) by mouth 2 (two) times daily. X 1 week then BID as-needed  (take with food)  Dispense: 60 tablet; Refill: 0     Orders & Medications Meds ordered this encounter  Medications   diclofenac (VOLTAREN) 75 MG EC tablet    Sig: Take 1 tablet (75 mg total) by mouth 2 (two) times daily. X 1 week then BID as-needed (take with food)    Dispense:  60 tablet    Refill:  0   No orders of the defined types were placed in this encounter.    No follow-ups on file.     Jerrol Banana, MD, [facility]   Primary Care Sports Medicine Primary Care and Sports Medicine at [facility]

## 2023-06-12 NOTE — Patient Instructions (Addendum)
-   Buddy-tape applied, continue if beneficial - Start diclofenac twice daily with food x 1 week then twice daily as-needed - If any stomach symptoms arise, restart pantoprazole - Start rehab exercises as below - If limited progress after 1-2 weeks, obtain "post-op shoe" and use daily x 2-3 weeks - Contact our office for any persistent symptoms at the 4 week mark or beyond

## 2023-06-12 NOTE — Assessment & Plan Note (Signed)
Acute, possibly traumatic onset of right great toe MTP pain in the setting of high baseline athletics (martial arts, gym, etc). Denies any ecchymosis, no significnat swelling. Has had pain with ambulation and primarily explosive motions but has otherwise been able to run/jog with slow ramp up.  Examination with minor 1st MTP swelling, no erythema or ecchymosis, has full, painful AROM, minimally painful PROM with flexion of the 1st MTP, tenderness at MTP. 5/5 strength noted.  Findings most consistent with sprain vs tendinopathy at the great toe. Plan as follows:  - Buddy-tape applied, continue if beneficial - Start diclofenac twice daily with food x 1 week then twice daily as-needed - If any stomach symptoms arise, restart pantoprazole - Start rehab exercises as below - If limited progress after 1-2 weeks, obtain "post-op shoe" and use daily x 2-3 weeks - Contact our office for any persistent symptoms at the 4 week mark or beyond

## 2023-07-03 ENCOUNTER — Ambulatory Visit: Payer: 59 | Admitting: Internal Medicine

## 2023-07-12 ENCOUNTER — Other Ambulatory Visit: Payer: Self-pay | Admitting: Family Medicine

## 2023-07-12 DIAGNOSIS — S93501A Unspecified sprain of right great toe, initial encounter: Secondary | ICD-10-CM

## 2023-07-13 ENCOUNTER — Other Ambulatory Visit: Payer: Self-pay | Admitting: Family Medicine

## 2023-07-13 ENCOUNTER — Ambulatory Visit: Payer: Self-pay | Admitting: *Deleted

## 2023-07-13 DIAGNOSIS — S93501A Unspecified sprain of right great toe, initial encounter: Secondary | ICD-10-CM

## 2023-07-13 NOTE — Telephone Encounter (Signed)
FYI

## 2023-07-13 NOTE — Telephone Encounter (Signed)
  Chief Complaint: Finger Injury Symptoms: Fell on finger at gym, Right hand, middle finger 3rd knuckle swollen, painful, bruised. 8/10 pain to touch. Frequency: Friday Pertinent Negatives: Patient denies  Disposition: [] ED /[x] Urgent Care (no appt availability in office) / [] Appointment(In office/virtual)/ []  [facility]/ [] Home Care/ [] Refused Recommended Disposition /[] [facility]/ []  Follow-up with PCP Additional Notes: No availability at practice, advised [facility]. States will follow disposition. CAre advise provided, pt verbalizes understanding.  Reason for Disposition  Large swelling or bruise  Answer Assessment - Initial Assessment Questions 1. MECHANISM: "How did the injury happen?"      Unsure, fell on it 2. ONSET: "When did the injury happen?" (Minutes or hours ago)      Friday afternoon 3. LOCATION: "What part of the finger is injured?" "Is the nail damaged?"      Large knuckle (3rd) on right middle finger 4. APPEARANCE of the INJURY: "What does the injury look like?"      Swollen 5. SEVERITY: "Can you use the hand normally?"  "Can you bend your fingers into a ball and then fully open them?"     Can't grab anything 6. SIZE: For cuts, bruises, or swelling, ask: "How large is it?" (e.g., inches or centimeters;  entire finger)      Bruising 7. PAIN: "Is there pain?" If Yes, ask: "How bad is the pain?"    (e.g., Scale 1-10; or mild, moderate, severe)  - NONE (0): no pain.  - MILD (1-3): doesn't interfere with normal activities.   - MODERATE (4-7): interferes with normal activities or awakens from sleep.  - SEVERE (8-10): excruciating pain, unable to hold a glass of water or bend finger even a little.     8/10 if touching 8. TETANUS: For any breaks in the skin, ask: "When was the last tetanus booster?"     No 9. OTHER SYMPTOMS: "Do you have any other symptoms?"     No  Protocols used: Finger Injury-A-AH

## 2023-07-14 NOTE — Telephone Encounter (Signed)
Requested medication (s) are due for refill today - yes  Requested medication (s) are on the active medication list -yes  Future visit scheduled -yes  Last refill: 06/12/23 #60  Notes to clinic: fails lab protocol- over 1 year-04/24/22  Requested Prescriptions  Pending Prescriptions Disp Refills   diclofenac (VOLTAREN) 75 MG EC tablet [Pharmacy Med Name: DICLOFENAC SODIUM 75MG  DR TABLETS] 60 tablet 0    Sig: TAKE 1 TABLET(75 MG) BY MOUTH TWICE DAILY AS NEEDED     Analgesics:  NSAIDS Failed - 07/12/2023 10:11 AM      Failed - Manual Review: Labs are only required if the patient has taken medication for more than 8 weeks.      Failed - Cr in normal range and within 360 days    Creat  Date Value Ref Range Status  04/24/2022 0.94 0.60 - 1.26 mg/dL Final         Failed - HGB in normal range and within 360 days    Hemoglobin  Date Value Ref Range Status  04/24/2022 11.7 (L) 13.2 - 17.1 g/dL Final  86/57/8469 62.9 12.6 - 17.7 g/dL Final         Failed - PLT in normal range and within 360 days    Platelets  Date Value Ref Range Status  04/24/2022 281 140 - 400 Thousand/uL Final  06/27/2015 342 150 - 379 x10E3/uL Final         Failed - HCT in normal range and within 360 days    HCT  Date Value Ref Range Status  04/24/2022 34.9 (L) 38.5 - 50.0 % Final   Hematocrit  Date Value Ref Range Status  06/27/2015 39.8 37.5 - 51.0 % Final         Failed - eGFR is 30 or above and within 360 days    GFR, Est African American  Date Value Ref Range Status  04/26/2021 111 > OR = 60 mL/min/1.26m2 Final   GFR, Est Non African American  Date Value Ref Range Status  04/26/2021 96 > OR = 60 mL/min/1.12m2 Final         Passed - Patient is not pregnant      Passed - Valid encounter within last 12 months    Recent Outpatient Visits           1 month ago Sprain of right great toe, initial encounter   [facility] Ashley Royalty, Ocie Bob, MD   1  year ago Annual physical exam   [place] [facility] Smitty Cords, DO   1 year ago Other injury of extensor muscle, fascia and tendon of right index finger at wrist and hand level, initial encounter   [facility] at [facility] Ashley Royalty, Ocie Bob, MD   1 year ago Other injury of extensor muscle, fascia and tendon of right index finger at wrist and hand level, initial encounter   [facility], Ocie Bob, MD   1 year ago Chronic foot pain, left   [facility] [facility] [place], Netta Neat, DO       Future Appointments             In 2 weeks Althea Charon, Netta Neat, DO [facility]               Requested Prescriptions  Pending  Prescriptions Disp Refills   diclofenac (VOLTAREN) 75 MG EC tablet [Pharmacy Med Name: DICLOFENAC SODIUM 75MG  DR TABLETS] 60 tablet 0    Sig: TAKE 1 TABLET(75 MG) BY MOUTH TWICE DAILY AS NEEDED     Analgesics:  NSAIDS Failed - 07/12/2023 10:11 AM      Failed - Manual Review: Labs are only required if the patient has taken medication for more than 8 weeks.      Failed - Cr in normal range and within 360 days    Creat  Date Value Ref Range Status  04/24/2022 0.94 0.60 - 1.26 mg/dL Final         Failed - HGB in normal range and within 360 days    Hemoglobin  Date Value Ref Range Status  04/24/2022 11.7 (L) 13.2 - 17.1 g/dL Final  16/08/9603 54.0 12.6 - 17.7 g/dL Final         Failed - PLT in normal range and within 360 days    Platelets  Date Value Ref Range Status  04/24/2022 281 140 - 400 Thousand/uL Final  06/27/2015 342 150 - 379 x10E3/uL Final         Failed - HCT in normal range and within 360 days    HCT  Date Value Ref Range Status  04/24/2022 34.9 (L) 38.5 - 50.0 % Final   Hematocrit  Date Value Ref Range Status  06/27/2015 39.8 37.5 - 51.0 % Final          Failed - eGFR is 30 or above and within 360 days    GFR, Est African American  Date Value Ref Range Status  04/26/2021 111 > OR = 60 mL/min/1.83m2 Final   GFR, Est Non African American  Date Value Ref Range Status  04/26/2021 96 > OR = 60 mL/min/1.87m2 Final         Passed - Patient is not pregnant      Passed - Valid encounter within last 12 months    Recent Outpatient Visits           1 month ago Sprain of right great toe, initial encounter   [place] [facility] Ashley Royalty, Ocie Bob, MD   1 year ago Annual physical exam   [place] [facility] Smitty Cords, DO   1 year ago Other injury of extensor muscle, fascia and tendon of right index finger at wrist and hand level, initial encounter   [facility] Ashley Royalty, Ocie Bob, MD   1 year ago Other injury of extensor muscle, fascia and tendon of right index finger at wrist and hand level, initial encounter   [facility], Ocie Bob, MD   1 year ago Chronic foot pain, left   [place] [facility] [place], Netta Neat, DO       Future Appointments             In 2 weeks Althea Charon, Netta Neat, DO [facility]

## 2023-07-14 NOTE — Telephone Encounter (Signed)
Requested medication (s) are due for refill today: Yes  Requested medication (s) are on the active medication list: Yes  Last refill:  06/12/23  Future visit scheduled: Yes  Notes to clinic:  Manual review.    Requested Prescriptions  Pending Prescriptions Disp Refills   diclofenac (VOLTAREN) 75 MG EC tablet [Pharmacy Med Name: DICLOFENAC SODIUM 75MG  DR TABLETS] 60 tablet 0    Sig: TAKE 1 TABLET(75 MG) BY MOUTH TWICE DAILY WITH FOOD FOR 1 WEEK THEN TWICE DAILY AS NEEDED     Analgesics:  NSAIDS Failed - 07/13/2023  3:29 AM      Failed - Manual Review: Labs are only required if the patient has taken medication for more than 8 weeks.      Failed - Cr in normal range and within 360 days    Creat  Date Value Ref Range Status  04/24/2022 0.94 0.60 - 1.26 mg/dL Final         Failed - HGB in normal range and within 360 days    Hemoglobin  Date Value Ref Range Status  04/24/2022 11.7 (L) 13.2 - 17.1 g/dL Final  16/08/9603 54.0 12.6 - 17.7 g/dL Final         Failed - PLT in normal range and within 360 days    Platelets  Date Value Ref Range Status  04/24/2022 281 140 - 400 Thousand/uL Final  06/27/2015 342 150 - 379 x10E3/uL Final         Failed - HCT in normal range and within 360 days    HCT  Date Value Ref Range Status  04/24/2022 34.9 (L) 38.5 - 50.0 % Final   Hematocrit  Date Value Ref Range Status  06/27/2015 39.8 37.5 - 51.0 % Final         Failed - eGFR is 30 or above and within 360 days    GFR, Est African American  Date Value Ref Range Status  04/26/2021 111 > OR = 60 mL/min/1.35m2 Final   GFR, Est Non African American  Date Value Ref Range Status  04/26/2021 96 > OR = 60 mL/min/1.49m2 Final         Passed - Patient is not pregnant      Passed - Valid encounter within last 12 months    Recent Outpatient Visits           1 month ago Sprain of right great toe, initial encounter   [facility] Royalty,  Ocie Bob, MD   1 year ago Annual physical exam   [facility] Smitty Cords, DO   1 year ago Other injury of extensor muscle, fascia and tendon of right index finger at wrist and hand level, initial encounter   [facility] Royalty, Ocie Bob, MD   1 year ago Other injury of extensor muscle, fascia and tendon of right index finger at wrist and hand level, initial encounter   [facility], Ocie Bob, MD   1 year ago Chronic foot pain, left   [facility] [place], Netta Neat, DO       Future Appointments             In 2 weeks Althea Charon, Netta Neat, DO [place] [facility], [facility]

## 2023-07-22 ENCOUNTER — Other Ambulatory Visit: Payer: Self-pay

## 2023-07-22 DIAGNOSIS — Z13220 Encounter for screening for lipoid disorders: Secondary | ICD-10-CM

## 2023-07-22 DIAGNOSIS — Z0000 Encounter for general adult medical examination without abnormal findings: Secondary | ICD-10-CM

## 2023-07-22 DIAGNOSIS — J3089 Other allergic rhinitis: Secondary | ICD-10-CM

## 2023-07-22 DIAGNOSIS — Z131 Encounter for screening for diabetes mellitus: Secondary | ICD-10-CM

## 2023-07-23 ENCOUNTER — Other Ambulatory Visit: Payer: 59

## 2023-07-31 ENCOUNTER — Encounter: Payer: 59 | Admitting: Family Medicine

## 2023-08-03 ENCOUNTER — Other Ambulatory Visit: Payer: 59

## 2023-08-04 LAB — CBC WITH DIFFERENTIAL/PLATELET
Absolute Monocytes: 683 {cells}/uL (ref 200–950)
Basophils Absolute: 79 {cells}/uL (ref 0–200)
Basophils Relative: 1.3 %
Eosinophils Absolute: 403 {cells}/uL (ref 15–500)
Eosinophils Relative: 6.6 %
HCT: 39.1 % (ref 38.5–50.0)
Hemoglobin: 12.9 g/dL — ABNORMAL LOW (ref 13.2–17.1)
Lymphs Abs: 2086 {cells}/uL (ref 850–3900)
MCH: 28.8 pg (ref 27.0–33.0)
MCHC: 33 g/dL (ref 32.0–36.0)
MCV: 87.3 fL (ref 80.0–100.0)
MPV: 10.2 fL (ref 7.5–12.5)
Monocytes Relative: 11.2 %
Neutro Abs: 2849 {cells}/uL (ref 1500–7800)
Neutrophils Relative %: 46.7 %
Platelets: 289 10*3/uL (ref 140–400)
RBC: 4.48 10*6/uL (ref 4.20–5.80)
RDW: 12.3 % (ref 11.0–15.0)
Total Lymphocyte: 34.2 %
WBC: 6.1 10*3/uL (ref 3.8–10.8)

## 2023-08-04 LAB — COMPREHENSIVE METABOLIC PANEL
AG Ratio: 1.5 (calc) (ref 1.0–2.5)
ALT: 27 U/L (ref 9–46)
AST: 19 U/L (ref 10–40)
Albumin: 4.4 g/dL (ref 3.6–5.1)
Alkaline phosphatase (APISO): 65 U/L (ref 36–130)
BUN: 18 mg/dL (ref 7–25)
CO2: 26 mmol/L (ref 20–32)
Calcium: 10 mg/dL (ref 8.6–10.3)
Chloride: 105 mmol/L (ref 98–110)
Creat: 1.21 mg/dL (ref 0.60–1.26)
Globulin: 3 g/dL (ref 1.9–3.7)
Glucose, Bld: 90 mg/dL (ref 65–99)
Potassium: 5.2 mmol/L (ref 3.5–5.3)
Sodium: 137 mmol/L (ref 135–146)
Total Bilirubin: 0.4 mg/dL (ref 0.2–1.2)
Total Protein: 7.4 g/dL (ref 6.1–8.1)

## 2023-08-04 LAB — LIPID PANEL
Cholesterol: 175 mg/dL (ref <200)
HDL: 52 mg/dL (ref >=40)
LDL Cholesterol (Calc): 111 mg/dL — ABNORMAL HIGH
Non-HDL Cholesterol (Calc): 123 mg/dL (ref <130)
Total CHOL/HDL Ratio: 3.4 (calc) (ref <5.0)
Triglycerides: 42 mg/dL (ref <150)

## 2023-08-04 LAB — HEMOGLOBIN A1C
Hgb A1c MFr Bld: 5.6 %{Hb} (ref <5.7)
Mean Plasma Glucose: 114 mg/dL
eAG (mmol/L): 6.3 mmol/L

## 2023-08-04 LAB — TSH: TSH: 1.36 m[IU]/L (ref 0.40–4.50)

## 2023-08-10 ENCOUNTER — Ambulatory Visit (INDEPENDENT_AMBULATORY_CARE_PROVIDER_SITE_OTHER): Payer: 59 | Admitting: Family Medicine

## 2023-08-10 ENCOUNTER — Encounter: Payer: Self-pay | Admitting: Family Medicine

## 2023-08-10 ENCOUNTER — Other Ambulatory Visit: Payer: Self-pay | Admitting: Family Medicine

## 2023-08-10 VITALS — BP 108/64 | HR 65 | Ht 70.0 in | Wt 153.0 lb

## 2023-08-10 DIAGNOSIS — Z0000 Encounter for general adult medical examination without abnormal findings: Secondary | ICD-10-CM | POA: Diagnosis not present

## 2023-08-10 DIAGNOSIS — R7309 Other abnormal glucose: Secondary | ICD-10-CM

## 2023-08-10 DIAGNOSIS — E7800 Pure hypercholesterolemia, unspecified: Secondary | ICD-10-CM

## 2023-08-10 NOTE — Patient Instructions (Addendum)
Thank you for coming to the office today.  Flu Shot at work  Tdap tetanus vaccine - send me message or call with the date you received it.  Caution with fried foods, red meat, pork, ice cream, heavy dairy  --------------  [employer] in [place], [place] Address: [address] [place], [place] [address] Open 24 hours Phone: [phone]   Diet Recommendations for Preventing Diabetes   REDUCE Starchy (carb) foods include: Bread, rice, pasta, potatoes, corn, crackers, bagels, muffins, all baked goods.   FRUITS - LIMIT these HIGH sugar/carb fruits = Pineapple, Watermelon, Bananas - OKAY with these MEDIUM sugar/carb fruits = Citrus, Oranges, Grapes - PREFER these LOW sugar/carb fruits = Apples, Berries, Pears, Plums  Protein foods include: Meat, fish, poultry, eggs, dairy foods, and beans such as pinto and kidney beans (beans also provide carbohydrate).   1. Eat at least 3 meals and 1-2 snacks per day. Never go more than 4-5 hours while awake without eating.   2. Limit starchy foods to TWO per meal and ONE per snack. ONE portion of a starchy  food is equal to the following:   - ONE slice of bread (or its equivalent, such as half of a hamburger bun).   - 1/2 cup of a "scoopable" starchy food such as potatoes or rice.   - 1 OUNCE (28 grams) of starchy snacks (crackers or pretzels, look on label).   - 15 grams of carbohydrate as shown on food label.   3. Both lunch and dinner should include a protein food, a carb food, and vegetables.   - Obtain twice as many veg's as protein or carbohydrate foods for both lunch and dinner.   - Try to keep frozen veg's on hand for a quick vegetable serving.     - Fresh or frozen veg's are best.   4. Breakfast should always include protein.    DUE for FASTING BLOOD WORK (no food or drink after midnight before the lab appointment, only water or coffee without cream/sugar on the morning of)  SCHEDULE "Lab Only" visit in the  morning at the clinic for lab draw in  6 MONTHS   - Make sure Lab Only appointment is at about 1 week before your next appointment, so that results will be available  For Lab Results, once available within 2-3 days of blood draw, you can can log in to MyChart online to view your results and a brief explanation. Also, we can discuss results at next follow-up visit.   Please schedule a Follow-up Appointment to: No follow-ups on file.  If you have any other questions or concerns, please feel free to call the office or send a message through MyChart. You may also schedule an earlier appointment if necessary.  Additionally, you may be receiving a survey about your experience at our office within a few days to 1 week by e-mail or mail. We value your feedback.  Saralyn Pilar, DO [facility], [place]

## 2023-08-10 NOTE — Progress Notes (Signed)
Subjective:    Patient ID: Alexander Ball, male    DOB: 05-20-1986, 37 y.o.   MRN: [id number]  Alexander Ball is a 38 y.o. male presenting on 08/10/2023 for Annual Exam   HPI  Here for Annual Physical and Lab Review   Discussed the use of AI scribe software for clinical note transcription with the patient, who gave verbal consent to proceed.  History of Present Illness   A 37 year old patient presents for an annual check-up.    Lifestyle / Wellness   The patient has been more active recently, participating in jujitsu and cardio exercises. The patient reports a history of a broken finger and a foot injury, both of which are healing. The patient admits to consuming more sweets and red meat recently. The patient's blood pressure is normal, and they have lost some weight   Elevated A1c Last lab shows A1c up from 5.4 to 5.6. No known fam history of Diabetes in parents due lack of historical data for parents. He has an aunt with Diabetes.   Elevated Cholesterol Last labs with elevation of cholesterol results. LDL 111, prior 76. Not able to easily identify cause. He admits some ice cream intake      Additional discussion he had injury to Left ear helix and developed swelling hematoma, he did a self drainage of blood and compressed it and it has healed but still slightly larger but no longer causing a problem.    Health Maintenance:  Decline COVID vaccine  Due Flu Vaccine, will get at work through [employer]  Last Tdap on our record 2014, due now but he reports received one recently. Will request copy of record.   Age 33, not indicated for early colon or prostate CA Screening. No known early family history of these cancers that would suggest earlier testing.      08/10/2023   10:39 AM 04/29/2022    8:51 AM 03/20/2022    1:38 PM  Depression screen PHQ 2/9  Decreased Interest 0 0   Down, Depressed, Hopeless 0 0 0  PHQ - 2 Score 0 0 0  Altered sleeping 0 0 0   Tired, decreased energy 0 0 0  Change in appetite 0 0 0  Feeling bad or failure about yourself  0 0 0  Trouble concentrating 0 0   Moving slowly or fidgety/restless 0 0 0  Suicidal thoughts 0 0 0  PHQ-9 Score 0 0 0  Difficult doing work/chores  Not difficult at all Not difficult at all    Past Medical History:  Diagnosis Date   Allergy    GERD (gastroesophageal reflux disease)    History reviewed. No pertinent surgical history. 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[employer])  Tobacco Use   Smoking status: Former    Current packs/day: 0.00    Types: Cigarettes    Quit date: 06/25/2014    Years since quitting: 9.1   Smokeless tobacco: Former  Building services engineer status: Never Used  Substance and Sexual Activity   Alcohol use: Yes    Comment: 2-4 times a month    Drug use: No   Sexual activity: Yes    Birth control/protection: Inserts, Injection  Other Topics Concern  Not on file  Social History Narrative   Not on file   Social Determinants of Health   Financial Resource Strain: Not on file  Food Insecurity: Not on file  Transportation Needs: Not on file  Physical Activity: Not on file  Stress: Not on file  Social Connections: Not on file  Intimate Partner Violence: Not on file   Family History  Problem Relation Age of Onset   Heart disease Maternal Uncle    Hypertension Maternal Uncle    Diabetes Maternal Aunt    Prostate cancer Neg Hx    Colon cancer Neg Hx    Current Outpatient Medications on File Prior to Visit  Medication Sig   diclofenac (VOLTAREN) 75 MG EC tablet TAKE 1 TABLET(75 MG) BY MOUTH TWICE DAILY AS NEEDED   No current facility-administered medications on file prior to visit.    Review of Systems Per HPI unless  specifically indicated above     Objective:    BP 108/64   Pulse 65   Ht 5\' 10"  (1.778 m)   Wt 153 lb (69.4 kg)   SpO2 98%   BMI 21.95 kg/m   Wt Readings from Last 3 Encounters:  08/10/23 153 lb (69.4 kg)  06/12/23 152 lb (68.9 kg)  04/29/22 166 lb (75.3 kg)    Physical Exam Vitals and nursing note reviewed.  Constitutional:      General: He is not in acute distress.    Appearance: He is well-developed. He is not diaphoretic.     Comments: Well-appearing, comfortable, cooperative  HENT:     Head: Normocephalic and atraumatic.  Eyes:     General:        Right eye: No discharge.        Left eye: No discharge.     Conjunctiva/sclera: Conjunctivae normal.     Pupils: Pupils are equal, round, and reactive to light.  Neck:     Thyroid: No thyromegaly.     Vascular: No carotid bruit.  Cardiovascular:     Rate and Rhythm: Normal rate and regular rhythm.     Pulses: Normal pulses.     Heart sounds: Normal heart sounds. No murmur heard. Pulmonary:     Effort: Pulmonary effort is normal. No respiratory distress.     Breath sounds: Normal breath sounds. No wheezing or rales.  Abdominal:     General: Bowel sounds are normal. There is no distension.     Palpations: Abdomen is soft. There is no mass.     Tenderness: There is no abdominal tenderness.  Musculoskeletal:        General: No tenderness. Normal range of motion.     Cervical back: Normal range of motion and neck supple.     Right lower leg: No edema.     Left lower leg: No edema.     Comments: Upper / Lower Extremities: - Normal muscle tone, strength bilateral upper extremities 5/5, lower extremities 5/5  Lymphadenopathy:     Cervical: No cervical adenopathy.  Skin:    General: Skin is warm and dry.     Findings: No erythema or rash.  Neurological:     Mental Status: He is alert and oriented to person, place, and time.     Comments: Distal sensation intact to light touch all extremities  Psychiatric:        Mood  and Affect: Mood normal.        Behavior: Behavior normal.        Thought  Content: Thought content normal.     Comments: Well groomed, good eye contact, normal speech and thoughts       Results for orders placed or performed in visit on 07/22/23  CBC with Differential/Platelet  Result Value Ref Range   WBC 6.1 3.8 - 10.8 Thousand/uL   RBC 4.48 4.20 - 5.80 Million/uL   Hemoglobin 12.9 (L) 13.2 - 17.1 g/dL   HCT 87.5 64.3 - 32.9 %   MCV 87.3 80.0 - 100.0 fL   MCH 28.8 27.0 - 33.0 pg   MCHC 33.0 32.0 - 36.0 g/dL   RDW 51.8 84.1 - 66.0 %   Platelets 289 140 - 400 Thousand/uL   MPV 10.2 7.5 - 12.5 fL   Neutro Abs 2,849 1,500 - 7,800 cells/uL   Lymphs Abs 2,086 850 - 3,900 cells/uL   Absolute Monocytes 683 200 - 950 cells/uL   Eosinophils Absolute 403 15 - 500 cells/uL   Basophils Absolute 79 0 - 200 cells/uL   Neutrophils Relative % 46.7 %   Total Lymphocyte 34.2 %   Monocytes Relative 11.2 %   Eosinophils Relative 6.6 %   Basophils Relative 1.3 %  Comprehensive metabolic panel  Result Value Ref Range   Glucose, Bld 90 65 - 99 mg/dL   BUN 18 7 - 25 mg/dL   Creat 6.30 1.60 - 1.09 mg/dL   BUN/Creatinine Ratio SEE NOTE: 6 - 22 (calc)   Sodium 137 135 - 146 mmol/L   Potassium 5.2 3.5 - 5.3 mmol/L   Chloride 105 98 - 110 mmol/L   CO2 26 20 - 32 mmol/L   Calcium 10.0 8.6 - 10.3 mg/dL   Total Protein 7.4 6.1 - 8.1 g/dL   Albumin 4.4 3.6 - 5.1 g/dL   Globulin 3.0 1.9 - 3.7 g/dL (calc)   AG Ratio 1.5 1.0 - 2.5 (calc)   Total Bilirubin 0.4 0.2 - 1.2 mg/dL   Alkaline phosphatase (APISO) 65 36 - 130 U/L   AST 19 10 - 40 U/L   ALT 27 9 - 46 U/L  Hemoglobin A1c  Result Value Ref Range   Hgb A1c MFr Bld 5.6 <5.7 % of total Hgb   Mean Plasma Glucose 114 mg/dL   eAG (mmol/L) 6.3 mmol/L  Lipid panel  Result Value Ref Range   Cholesterol 175 <200 mg/dL   HDL 52 > OR = 40 mg/dL   Triglycerides 42 <323 mg/dL   LDL Cholesterol (Calc) 111 (H) mg/dL (calc)   Total CHOL/HDL Ratio 3.4  <5.0 (calc)   Non-HDL Cholesterol (Calc) 123 <130 mg/dL (calc)  TSH  Result Value Ref Range   TSH 1.36 0.40 - 4.50 mIU/L      Assessment & Plan:   Problem List Items Addressed This Visit   None Visit Diagnoses     Annual physical exam    -  Primary       Updated Health Maintenance information Reviewed recent lab results with patient Encouraged improvement to lifestyle with diet and exercise  Assessment and Plan    Hyperlipidemia LDL cholesterol increased from 76 to 111 over the past year. Discussed the importance of diet modification, particularly reducing intake of high cholesterol foods. No need for medication at this time. -Encouraged patient to monitor diet and continue exercise regimen. -Repeat lipid panel in 6 months.  Impaired Glucose Tolerance HbA1c increased from 5.4 to 5.6, nearing the prediabetes threshold. Discussed the importance of diet and exercise in managing blood glucose levels. Possible family history of  diabetes. -Encouraged patient to monitor diet and continue exercise regimen. -Repeat HbA1c in 6 months.  Immunizations Patient reports receiving recent tetanus shot, but date is unknown. Patient plans to receive flu shot at work. -Advised patient to send message or call with the date of recent tetanus shot.    No orders of the defined types were placed in this encounter.    Follow up plan: Return in about 6 months (around 02/07/2024) for 6 month fasting lab only, no apt required..  Future labs Lipid + A1c. No apt needed for review, will send to MyChart and discuss and schedule only if needed.  Saralyn Pilar, DO [facility] 08/10/2023, 10:35 AM

## 2023-08-17 ENCOUNTER — Other Ambulatory Visit: Payer: Self-pay | Admitting: Family Medicine

## 2023-08-17 DIAGNOSIS — S93501A Unspecified sprain of right great toe, initial encounter: Secondary | ICD-10-CM

## 2023-08-18 NOTE — Telephone Encounter (Signed)
Requested medication (s) are due for refill today: Yes  Requested medication (s) are on the active medication list: Yes  Last refill:  07/14/23  Future visit scheduled: No  Notes to clinic:  Manual review.    Requested Prescriptions  Pending Prescriptions Disp Refills   diclofenac (VOLTAREN) 75 MG EC tablet [Pharmacy Med Name: DICLOFENAC SODIUM 75MG  DR TABLETS] 60 tablet 0    Sig: TAKE 1 TABLET(75 MG) BY MOUTH TWICE DAILY AS NEEDED     Analgesics:  NSAIDS Failed - 08/17/2023  3:30 AM      Failed - Manual Review: Labs are only required if the patient has taken medication for more than 8 weeks.      Failed - HGB in normal range and within 360 days    Hemoglobin  Date Value Ref Range Status  08/03/2023 12.9 (L) 13.2 - 17.1 g/dL Final  54/62/7035 00.9 12.6 - 17.7 g/dL Final         Failed - eGFR is 30 or above and within 360 days    GFR, Est African American  Date Value Ref Range Status  04/26/2021 111 > OR = 60 mL/min/1.28m2 Final   GFR, Est Non African American  Date Value Ref Range Status  04/26/2021 96 > OR = 60 mL/min/1.12m2 Final         Passed - Cr in normal range and within 360 days    Creat  Date Value Ref Range Status  08/03/2023 1.21 0.60 - 1.26 mg/dL Final         Passed - PLT in normal range and within 360 days    Platelets  Date Value Ref Range Status  08/03/2023 289 140 - 400 Thousand/uL Final  06/27/2015 342 150 - 379 x10E3/uL Final         Passed - HCT in normal range and within 360 days    HCT  Date Value Ref Range Status  08/03/2023 39.1 38.5 - 50.0 % Final   Hematocrit  Date Value Ref Range Status  06/27/2015 39.8 37.5 - 51.0 % Final         Passed - Patient is not pregnant      Passed - Valid encounter within last 12 months    Recent Outpatient Visits           1 week ago Annual physical exam    [facility] [place], Netta Neat, DO   2 months ago Sprain of right great toe, initial encounter   [facility], Ocie Bob, MD   1 year ago Annual physical exam    [facility] Smitty Cords, DO   1 year ago Other injury of extensor muscle, fascia and tendon of right index finger at wrist and hand level, initial encounter   [facility] Ashley Royalty, Ocie Bob, MD   1 year ago Other injury of extensor muscle, fascia and tendon of right index finger at wrist and hand level, initial encounter   [facility] at [facility], Ocie Bob, MD

## 2024-01-04 ENCOUNTER — Ambulatory Visit
Admission: RE | Admit: 2024-01-04 | Discharge: 2024-01-04 | Disposition: A | Discharge status: Home / Self Care | Payer: Worker's Compensation | Source: Ambulatory Visit | Attending: Nurse Practitioner | Admitting: Nurse Practitioner

## 2024-01-04 ENCOUNTER — Other Ambulatory Visit: Payer: Self-pay | Admitting: Nurse Practitioner

## 2024-01-04 DIAGNOSIS — S6990XA Unspecified injury of unspecified wrist, hand and finger(s), initial encounter: Secondary | ICD-10-CM

## 2024-02-08 ENCOUNTER — Telehealth: Payer: Self-pay

## 2024-02-08 NOTE — Telephone Encounter (Signed)
 Rescheduled lab appt for 02/16/2024 per patients request.

## 2024-02-08 NOTE — Telephone Encounter (Signed)
 Copied from CRM [id number]. Topic: Appointments - Appointment Cancel/Reschedule >> Feb 08, 2024  8:10 AM Nyra Capes wrote: Patient/patient representative is calling to cancel or reschedule an appointment. Refer to attachments for appointment information. Patient canceling lab appt and wanting to reschedule  appt. Patient would like to reschedule on the 03/19 or 03/20

## 2024-02-09 ENCOUNTER — Other Ambulatory Visit: Payer: Self-pay

## 2024-02-16 ENCOUNTER — Other Ambulatory Visit: Payer: Self-pay

## 2024-02-16 DIAGNOSIS — E7800 Pure hypercholesterolemia, unspecified: Secondary | ICD-10-CM

## 2024-02-16 DIAGNOSIS — R7309 Other abnormal glucose: Secondary | ICD-10-CM

## 2024-02-17 ENCOUNTER — Encounter: Payer: Self-pay | Admitting: Family Medicine

## 2024-02-17 LAB — LIPID PANEL
Cholesterol: 128 mg/dL (ref <200)
HDL: 34 mg/dL — ABNORMAL LOW (ref >=40)
LDL Cholesterol (Calc): 81 mg/dL
Non-HDL Cholesterol (Calc): 94 mg/dL (ref <130)
Total CHOL/HDL Ratio: 3.8 (calc) (ref <5.0)
Triglycerides: 51 mg/dL (ref <150)

## 2024-02-17 LAB — HEMOGLOBIN A1C
Hgb A1c MFr Bld: 5.5 %{Hb} (ref <5.7)
Mean Plasma Glucose: 111 mg/dL
eAG (mmol/L): 6.2 mmol/L

## 2024-02-18 ENCOUNTER — Other Ambulatory Visit: Payer: Self-pay | Admitting: Family Medicine

## 2024-02-18 DIAGNOSIS — R7309 Other abnormal glucose: Secondary | ICD-10-CM

## 2024-02-18 DIAGNOSIS — D649 Anemia, unspecified: Secondary | ICD-10-CM

## 2024-02-18 DIAGNOSIS — Z0000 Encounter for general adult medical examination without abnormal findings: Secondary | ICD-10-CM

## 2024-02-18 DIAGNOSIS — E7800 Pure hypercholesterolemia, unspecified: Secondary | ICD-10-CM

## 2024-08-24 ENCOUNTER — Other Ambulatory Visit

## 2024-08-24 DIAGNOSIS — D649 Anemia, unspecified: Secondary | ICD-10-CM

## 2024-08-24 DIAGNOSIS — R7309 Other abnormal glucose: Secondary | ICD-10-CM

## 2024-08-24 DIAGNOSIS — E7800 Pure hypercholesterolemia, unspecified: Secondary | ICD-10-CM

## 2024-08-24 DIAGNOSIS — Z0000 Encounter for general adult medical examination without abnormal findings: Secondary | ICD-10-CM

## 2024-08-25 ENCOUNTER — Ambulatory Visit: Payer: Self-pay | Admitting: Family Medicine

## 2024-08-25 LAB — CBC WITH DIFFERENTIAL/PLATELET
Absolute Lymphocytes: 3060 {cells}/uL (ref 850–3900)
Absolute Monocytes: 842 {cells}/uL (ref 200–950)
Basophils Absolute: 111 {cells}/uL (ref 0–200)
Basophils Relative: 1.3 %
Eosinophils Absolute: 425 {cells}/uL (ref 15–500)
Eosinophils Relative: 5 %
HCT: 41.4 % (ref 38.5–50.0)
Hemoglobin: 13.5 g/dL (ref 13.2–17.1)
MCH: 28.8 pg (ref 27.0–33.0)
MCHC: 32.6 g/dL (ref 32.0–36.0)
MCV: 88.3 fL (ref 80.0–100.0)
MPV: 10.2 fL (ref 7.5–12.5)
Monocytes Relative: 9.9 %
Neutro Abs: 4063 {cells}/uL (ref 1500–7800)
Neutrophils Relative %: 47.8 %
Platelets: 305 10*3/uL (ref 140–400)
RBC: 4.69 Million/uL (ref 4.20–5.80)
RDW: 12.2 % (ref 11.0–15.0)
Total Lymphocyte: 36 %
WBC: 8.5 10*3/uL (ref 3.8–10.8)

## 2024-08-25 LAB — COMPLETE METABOLIC PANEL WITHOUT GFR
AG Ratio: 1.5 (calc) (ref 1.0–2.5)
ALT: 11 U/L (ref 9–46)
AST: 13 U/L (ref 10–40)
Albumin: 4.5 g/dL (ref 3.6–5.1)
Alkaline phosphatase (APISO): 51 U/L (ref 36–130)
BUN: 14 mg/dL (ref 7–25)
CO2: 29 mmol/L (ref 20–32)
Calcium: 9.7 mg/dL (ref 8.6–10.3)
Chloride: 104 mmol/L (ref 98–110)
Creat: 0.98 mg/dL (ref 0.60–1.26)
Globulin: 3 g/dL (ref 1.9–3.7)
Glucose, Bld: 95 mg/dL (ref 65–99)
Potassium: 4.6 mmol/L (ref 3.5–5.3)
Sodium: 139 mmol/L (ref 135–146)
Total Bilirubin: 0.5 mg/dL (ref 0.2–1.2)
Total Protein: 7.5 g/dL (ref 6.1–8.1)

## 2024-08-25 LAB — LIPID PANEL
Cholesterol: 146 mg/dL
HDL: 61 mg/dL
LDL Cholesterol (Calc): 74 mg/dL
Non-HDL Cholesterol (Calc): 85 mg/dL
Total CHOL/HDL Ratio: 2.4 (calc)
Triglycerides: 40 mg/dL

## 2024-08-25 LAB — HEMOGLOBIN A1C
Hgb A1c MFr Bld: 5.3 %
Mean Plasma Glucose: 105 mg/dL
eAG (mmol/L): 5.8 mmol/L

## 2024-08-25 LAB — TSH: TSH: 1.76 m[IU]/L (ref 0.40–4.50)

## 2024-08-31 ENCOUNTER — Ambulatory Visit: Admitting: Family Medicine

## 2024-08-31 ENCOUNTER — Encounter: Payer: Self-pay | Admitting: Family Medicine

## 2024-08-31 VITALS — BP 118/70 | HR 90 | Ht 70.0 in | Wt 146.2 lb

## 2024-08-31 DIAGNOSIS — Z0000 Encounter for general adult medical examination without abnormal findings: Secondary | ICD-10-CM | POA: Diagnosis not present

## 2024-08-31 NOTE — Patient Instructions (Addendum)
 Thank you for coming to the office today.  Lab results look great overall Cholesterol improved  A1c improved  Recent Labs    02/16/24 0857 08/24/24 0837  HGBA1C 5.5 5.3   Try 2 weeks Omeprazole 20mg  daily before meal see if this helps resolve.  If not improving message or call we can order ZIO Patch monitor and consider a CT Scan of heart.  If you want to sit back down we can come back and do evaluation here in office again.   Or simply refer to cardiology for evaluation.  If you have any significant chest pain that does not go away within 30 minutes, is accompanied by nausea, sweating, shortness of breath, or made worse by activity, this may be evidence of a heart attack, especially if symptoms worsening instead of improving, please call 911 or go directly to the emergency room immediately for evaluation.   You have been referred for a Coronary Calcium Score Cardiac CT Scan. This is a screening test for patients aged 53-50+ with cardiovascular risk factors or who are healthy but would be interested in Cardiovascular Screening for heart disease. Even if there is a family history of heart disease, this imaging can be useful. Typically it can be done every 5+ years or at a different timeline we agree on  The scan will look at the chest and mainly focus on the heart and identify early signs of calcium build up or blockages within the heart arteries. It is not 100% accurate for identifying blockages or heart disease, but it is useful to help us  predict who may have some early changes or be at risk in the future for a heart attack or cardiovascular problem.  The results are reviewed by a Cardiologist and they will document the results. It should become available on MyChart. Typically the results are divided into percentiles based on other patients of the same demographic and age. So it will compare your risk to others similar to you. If you have a higher score >99 or higher percentile  >75%tile, it is recommended to consider Statin cholesterol therapy and or referral to Cardiologist. I will try to help explain your results and if we have questions we can contact the Cardiologist.  You will be contacted for scheduling. Usually it is done at any imaging facility through [facility], [facility] or [facility].  The cost is $99 flat fee total and it does not go through insurance, so no authorization is required.    Please schedule a Follow-up Appointment to: Return for 1 year fasting lab > 1 week later Annual Physical.  If you have any other questions or concerns, please feel free to call the office or send a message through MyChart. You may also schedule an earlier appointment if necessary.  Additionally, you may be receiving a survey about your experience at our office within a few days to 1 week by e-mail or mail. We value your feedback.  Marsa Officer, DO [facility], [place]

## 2024-08-31 NOTE — Progress Notes (Signed)
 Subjective:    Patient ID: Alexander Ball, male    DOB: [birth year], 38 y.o.   MRN: [id number]  Alexander Ball is a 38 y.o. male presenting on 08/31/2024 for Annual Exam   HPI  Discussed the use of AI scribe software for clinical note transcription with the patient, who gave verbal consent to proceed.  History of Present Illness   Alexander Ball is a 38 year old male who presents for an annual physical exam.   Hematologic findings - History of mild anemia  Metabolic and laboratory findings - Blood glucose is 95 mg/dL - Hemoglobin J8r is 4.6% (previously 5.6%) - LDL cholesterol is 74 mg/dL (previously 81 mg/dL). Note he does not have history of high cholesterol, he had one mild elevated reading, otherwise pattern suggests normal controlled cholesterol with fasting labs and improved lifestyle. - HDL cholesterol is 61 mg/dL  Weight and dietary changes - Weight decreased from 153 pounds to 146 pounds over the past year - Attributes weight loss to changes in eating habits and lifestyle since the birth of his child - Current diet consists of small meals and snacks  Exercise tolerance and activity - Exercise frequency decreased since the birth of his newborn two months ago - Has been to the gym twice since the baby's birth and finds it exhausting - Plans to increase running in preparation for a marathon in April 2026, he did run a 10k back in Spring 2025  Sleep disturbance - Sleep disruption attributed to newborn's colic and care needs - Describes sleep issues as situational      Atypical Chest Pressure / Tachycardia / Palpitations - Experiences occasional skipping heartbeats and mild chest pressure, described as unusual but not painful - Symptoms have increased in frequency over the past six months - Random occurrence, not exertional related - No chest pain, shortness of breath, or palpitations - History of gastroesophageal reflux disease (GERD) - Not  currently taking medication for GERD, off pantoprazole  - Admits major stressors lately 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          Health Maintenance:   Decline COVID vaccine   Due Flu Vaccine, will get at work through [employer]   Updated TDap   Age 44, not indicated for early colon or prostate CA Screening. No known early family history of these cancers that would suggest earlier testing.      08/31/2024    1:16 PM 08/10/2023   10:39 AM 04/29/2022    8:51 AM  Depression screen PHQ 2/9  Decreased Interest 0 0 0  Down, Depressed, Hopeless 0 0 0  PHQ - 2 Score 0 0 0  Altered sleeping 0 0 0  Tired, decreased energy 0 0 0  Change in appetite 0 0 0  Feeling bad or failure about yourself  0 0 0  Trouble concentrating 0 0 0  Moving slowly or fidgety/restless 0 0 0  Suicidal thoughts 0 0 0  PHQ-9 Score 0 0 0  Difficult doing work/chores   Not difficult at all       08/31/2024    1:16 PM 08/10/2023   10:39 AM 04/29/2022    8:52 AM 03/20/2022    1:38 PM  GAD 7 : Generalized Anxiety Score  Nervous, Anxious, on Edge 0 0 0 0  Control/stop worrying 0 0 0 0  Worry too much - different things 0 0 0 0  Trouble relaxing 0 0 0 0  Restless 0 0 0 0  Easily annoyed or irritable 0 0 0 0  Afraid - awful might happen 0 0 0 0  Total GAD 7 Score 0 0 0 0  Anxiety Difficulty   Not difficult at all Not difficult at all     Past Medical History:  Diagnosis Date   Allergy    GERD (gastroesophageal reflux disease)    History reviewed. No pertinent surgical history. 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[employer])  Tobacco Use   Smoking status: Former     Current packs/day: 0.00    Types: Cigarettes    Quit date: 06/25/2014    Years since quitting: 10.1   Smokeless tobacco: Former  Building services engineer status: Never Used  Substance and Sexual Activity   Alcohol use: Yes    Comment: 2-4 times a month    Drug use: No   Sexual activity: Yes    Birth control/protection: Inserts, Injection  Other Topics Concern   Not on file  Social History Narrative   Not on file   Social Drivers of Health   Financial Resource Strain: Not on file  Food Insecurity: Not on file  Transportation Needs: Not on file  Physical Activity: Not on file  Stress: Not on file  Social Connections: Not on file  Intimate Partner Violence: Not on file   Family History  Problem Relation Age of Onset   Heart disease Maternal Uncle    Hypertension Maternal Uncle    Diabetes Maternal Aunt    Prostate cancer Neg Hx    Colon cancer Neg Hx    No current outpatient medications on file prior to visit.   No current facility-administered medications on file prior to visit.    Review of Systems  Constitutional:  Negative for activity change, appetite change, chills, diaphoresis, fatigue and fever.  HENT:  Negative for congestion and hearing loss.   Eyes:  Negative for visual disturbance.  Respiratory:  Negative for cough, chest tightness, shortness of breath and wheezing.   Cardiovascular:  Positive for palpitations. Negative for chest pain and leg swelling.  Gastrointestinal:  Negative for abdominal pain, constipation, diarrhea, nausea and vomiting.  Genitourinary:  Negative for dysuria, frequency and hematuria.  Musculoskeletal:  Negative for arthralgias and neck pain.  Skin:  Negative for rash.  Neurological:  Negative for dizziness, weakness, light-headedness, numbness and headaches.  Hematological:  Negative for adenopathy.  Psychiatric/Behavioral:  Negative for behavioral problems, dysphoric mood and sleep disturbance.    Per HPI unless specifically  indicated above     Objective:    BP 118/70 (BP Location: Left Arm, Patient Position: Sitting, Cuff Size: Normal)   Pulse 90   Ht 5' 10 (1.778 m)   Wt 146 lb 4 oz (66.3 kg)   SpO2 96%   BMI 20.98 kg/m   Wt Readings from Last 3 Encounters:  08/31/24 146 lb 4 oz (66.3 kg)  08/10/23 153 lb (69.4 kg)  06/12/23 152 lb (68.9 kg)    Physical Exam Vitals and nursing note reviewed.  Constitutional:      General: He is not in acute  distress.    Appearance: He is well-developed. He is not diaphoretic.     Comments: Well-appearing, comfortable, cooperative  HENT:     Head: Normocephalic and atraumatic.     Right Ear: Tympanic membrane, ear canal and external ear normal. There is no impacted cerumen.     Left Ear: Tympanic membrane, ear canal and external ear normal. There is no impacted cerumen.  Eyes:     General:        Right eye: No discharge.        Left eye: No discharge.     Conjunctiva/sclera: Conjunctivae normal.     Pupils: Pupils are equal, round, and reactive to light.  Neck:     Thyroid: No thyromegaly.     Vascular: No carotid bruit.  Cardiovascular:     Rate and Rhythm: Regular rhythm. Tachycardia present.     Pulses: Normal pulses.     Heart sounds: Normal heart sounds. No murmur heard. Pulmonary:     Effort: Pulmonary effort is normal. No respiratory distress.     Breath sounds: Normal breath sounds. No wheezing or rales.  Chest:     Chest wall: No tenderness.  Abdominal:     General: Bowel sounds are normal. There is no distension.     Palpations: Abdomen is soft. There is no mass.     Tenderness: There is no abdominal tenderness.  Musculoskeletal:        General: No tenderness. Normal range of motion.     Cervical back: Normal range of motion and neck supple.     Right lower leg: No edema.     Left lower leg: No edema.     Comments: Upper / Lower Extremities: - Normal muscle tone, strength bilateral upper extremities 5/5, lower extremities 5/5   Lymphadenopathy:     Cervical: No cervical adenopathy.  Skin:    General: Skin is warm and dry.     Findings: No erythema or rash.  Neurological:     Mental Status: He is alert and oriented to person, place, and time.     Comments: Distal sensation intact to light touch all extremities  Psychiatric:        Mood and Affect: Mood normal.        Behavior: Behavior normal.        Thought Content: Thought content normal.     Comments: Well groomed, good eye contact, normal speech and thoughts     Results for orders placed or performed in visit on 08/24/24  CBC with Differential/Platelet   Collection Time: 08/24/24  8:37 AM  Result Value Ref Range   WBC 8.5 3.8 - 10.8 Thousand/uL   RBC 4.69 4.20 - 5.80 Million/uL   Hemoglobin 13.5 13.2 - 17.1 g/dL   HCT 58.5 61.4 - 49.9 %   MCV 88.3 80.0 - 100.0 fL   MCH 28.8 27.0 - 33.0 pg   MCHC 32.6 32.0 - 36.0 g/dL   RDW 87.7 88.9 - 84.9 %   Platelets 305 140 - 400 Thousand/uL   MPV 10.2 7.5 - 12.5 fL   Neutro Abs 4,063 1,500 - 7,800 cells/uL   Absolute Lymphocytes 3,060 850 - 3,900 cells/uL   Absolute Monocytes 842 200 - 950 cells/uL   Eosinophils Absolute 425 15 - 500 cells/uL   Basophils Absolute 111 0 - 200 cells/uL   Neutrophils Relative % 47.8 %   Total Lymphocyte 36.0 %   Monocytes Relative 9.9 %   Eosinophils Relative 5.0 %  Basophils Relative 1.3 %  COMPLETE METABOLIC PANEL WITHOUT GFR   Collection Time: 08/24/24  8:37 AM  Result Value Ref Range   Glucose, Bld 95 65 - 99 mg/dL   BUN 14 7 - 25 mg/dL   Creat 9.01 9.39 - 8.73 mg/dL   BUN/Creatinine Ratio SEE NOTE: 6 - 22 (calc)   Sodium 139 135 - 146 mmol/L   Potassium 4.6 3.5 - 5.3 mmol/L   Chloride 104 98 - 110 mmol/L   CO2 29 20 - 32 mmol/L   Calcium 9.7 8.6 - 10.3 mg/dL   Total Protein 7.5 6.1 - 8.1 g/dL   Albumin 4.5 3.6 - 5.1 g/dL   Globulin 3.0 1.9 - 3.7 g/dL (calc)   AG Ratio 1.5 1.0 - 2.5 (calc)   Total Bilirubin 0.5 0.2 - 1.2 mg/dL   Alkaline phosphatase  (APISO) 51 36 - 130 U/L   AST 13 10 - 40 U/L   ALT 11 9 - 46 U/L  Hemoglobin A1c   Collection Time: 08/24/24  8:37 AM  Result Value Ref Range   Hgb A1c MFr Bld 5.3 <5.7 %   Mean Plasma Glucose 105 mg/dL   eAG (mmol/L) 5.8 mmol/L  Lipid panel   Collection Time: 08/24/24  8:37 AM  Result Value Ref Range   Cholesterol 146 <200 mg/dL   HDL 61 > OR = 40 mg/dL   Triglycerides 40 <849 mg/dL   LDL Cholesterol (Calc) 74 mg/dL (calc)   Total CHOL/HDL Ratio 2.4 <5.0 (calc)   Non-HDL Cholesterol (Calc) 85 <869 mg/dL (calc)  TSH   Collection Time: 08/24/24  8:37 AM  Result Value Ref Range   TSH 1.76 0.40 - 4.50 mIU/L      Assessment & Plan:   Problem List Items Addressed This Visit   None Visit Diagnoses       Annual physical exam    -  Primary        Updated Health Maintenance information Reviewed recent lab results with patient Encouraged improvement to lifestyle with diet and exercise Goal of weight loss  Adult Wellness Visit Reviewed immunization status. Discussed hepatitis B immunity. Recent blood work normal. A1c improved to 5.3. Cholesterol levels improved. Discussed lifestyle changes and exercise plans. - Schedule next annual wellness visit in one year. - Update immunization records with flu shot and TDAP dates. - Monitor weight and lifestyle changes. - Encourage regular exercise and healthy diet.      Additional topics advice given today  Palpitations and chest pressure GERD Intermittent palpitations and mild chest pressure increasing in frequency, non exertional, atypical features, otherwise very healthy age 78 year old with excellent labs. We discussed unlikely to be cardiac etiology, however we can investigate further based on course. Could be stress with newborn and sleep patterns or other triggers.  Differential includes cardiac issues or GERD. Potential causes discussed: stress, dehydration, exercise.  No rx management or tests ordered. He was given advice to  contact us  back soon with follow-up on these issues and we can discuss at next visit if needed.  He remains off PPI for a while now, has resolved with lifestyle changes. Maybe GERD flare up again - Trial over-the-counter omeprazole for two weeks to rule out GERD. - Consider heart monitor if symptoms persist. ZIO patch monitor for his reported infrequent palpitations, tachycardia - Consider CT scan for cardiac evaluation if symptoms persist. Age < 40 but we discussed I can offer this screening test for him as well. - Follow  up if symptoms worsen or new symptoms develop. - Strict return precautions given if he has severe or sudden worsening symptoms, when to seek care     No orders of the defined types were placed in this encounter.   No orders of the defined types were placed in this encounter.    Follow up plan: Return for 1 year fasting lab > 1 week later Annual Physical.  Marsa Officer, DO [facility] 08/31/2024, 1:22 PM

## 2024-12-30 ENCOUNTER — Ambulatory Visit

## 2024-12-30 VITALS — BP 129/82 | HR 89 | Temp 97.7°F | Wt 161.2 lb

## 2024-12-30 DIAGNOSIS — J069 Acute upper respiratory infection, unspecified: Secondary | ICD-10-CM

## 2024-12-30 LAB — POC COVID19/FLU A&B COMBO
Covid Antigen, POC: NEGATIVE
Influenza A Antigen, POC: NEGATIVE
Influenza B Antigen, POC: NEGATIVE

## 2024-12-30 LAB — POCT RAPID STREP A (OFFICE): Rapid Strep A Screen: NEGATIVE

## 2024-12-30 NOTE — Progress Notes (Signed)
 "    Acute visit   Patient: Alexander Ball   DOB: 01-18-1986   39 y.o. Male  MRN: [id number] PCP: Edman Marsa PARAS, DO   Chief Complaint  Patient presents with   Sore Throat    Started Monday scratchy throat with some congestion  No fever but some headaches   Subjective    Discussed the use of AI scribe software for clinical note transcription with the patient, who gave verbal consent to proceed.  History of Present Illness Alexander Ball is a 39 year old male who presents with a sore throat and mild upper respiratory symptoms.  He has experienced a scratchy throat since Monday, which has progressed to a sore throat over the last day and a half. He has no history of frequent sore throats. He has been wearing a mask at home to prevent spreading any illness to his daughter.  He notes some congestion and a runny nose, but no significant sinus pressure. He has experienced headaches, which he attributes to poor sleep, but denies any sinus headaches. No fever, chills, shortness of breath, or chest pain.  He has been in contact with sick coworkers, with one person coming to work sick and subsequently others, including himself, falling ill. He notes that four out of eight people in his group at work have been sick.  He has taken DayQuil and one Advil Cold and Sinus, with negligible relief.  He is concerned about the duration of his coworkers' illnesses, which have lasted up to two weeks with lingering sore throats and coughs.   Review of systems as noted in HPI.   Objective    BP 129/82   Pulse 89   Temp 97.7 F (36.5 C) (Oral)   Wt 161 lb 3.2 oz (73.1 kg)   BMI 23.13 kg/m  Physical Exam Constitutional:      Appearance: Normal appearance.  HENT:     Head: Normocephalic and atraumatic.     Right Ear: Tympanic membrane, ear canal and external ear normal.     Left Ear: Tympanic membrane, ear canal and external ear normal.     Nose: Congestion present.      Mouth/Throat:     Mouth: Mucous membranes are moist.     Pharynx: Posterior oropharyngeal erythema present. No oropharyngeal exudate.  Eyes:     Pupils: Pupils are equal, round, and reactive to light.  Cardiovascular:     Rate and Rhythm: Normal rate and regular rhythm.     Heart sounds: Normal heart sounds.  Pulmonary:     Effort: Pulmonary effort is normal. No respiratory distress.     Breath sounds: Normal breath sounds.  Skin:    General: Skin is warm.  Neurological:     General: No focal deficit present.     Mental Status: He is alert.      Results for orders placed or performed in visit on 12/30/24  POCT rapid strep A  Result Value Ref Range   Rapid Strep A Screen Negative Negative  POC Covid19/Flu A&B Antigen  Result Value Ref Range   Influenza A Antigen, POC Negative Negative   Influenza B Antigen, POC Negative Negative   Covid Antigen, POC Negative Negative    Assessment & Plan     Problem List Items Addressed This Visit   None Visit Diagnoses       Upper respiratory tract infection, unspecified type    -  Primary   Relevant Orders   POCT rapid  strep A (Completed)   POC Covid19/Flu A&B Antigen (Completed)      Assessment & Plan Acute upper respiratory infection Patient with 4 day hx of sore throat, mild congestion, and runny nose. No fever, chills, shortness of breath, or chest pain. Negative for COVID, flu, and strep throat. Likely viral etiology. No signs of bacterial infection at this time. - Advised taking ibuprofen for sore throat relief. - Encouraged drinking warm liquids, such as tea with honey, to soothe the throat. - Recommend Mucinex for sinus congesiton - Recommended wearing a mask to prevent potential spread. - Instructed to monitor symptoms and report if condition worsens.    No orders of the defined types were placed in this encounter.    No follow-ups on file.      Isaiah DELENA Pepper, MD  [facility] [phone] (phone) [phone] (fax) "

## 2025-08-31 ENCOUNTER — Other Ambulatory Visit

## 2025-09-08 ENCOUNTER — Encounter: Admitting: Family Medicine
